# Patient Record
Sex: Female | Born: 1950 | Race: White | Hispanic: No | Marital: Married | Smoking: Former smoker
Health system: Southern US, Community
[De-identification: ages and names within clinical notes are randomized; demographics above are authoritative.]

## PROBLEM LIST (undated history)

## (undated) DIAGNOSIS — E785 Hyperlipidemia, unspecified: Secondary | ICD-10-CM

## (undated) DIAGNOSIS — I480 Paroxysmal atrial fibrillation: Secondary | ICD-10-CM

## (undated) HISTORY — DX: Hyperlipidemia, unspecified: E78.5

---

## 1998-08-19 ENCOUNTER — Other Ambulatory Visit: Admission: RE | Admit: 1998-08-19 | Discharge: 1998-08-19 | Payer: Self-pay | Admitting: *Deleted

## 1999-11-06 ENCOUNTER — Other Ambulatory Visit: Admission: RE | Admit: 1999-11-06 | Discharge: 1999-11-06 | Payer: Self-pay | Admitting: *Deleted

## 2001-12-04 ENCOUNTER — Other Ambulatory Visit: Admission: RE | Admit: 2001-12-04 | Discharge: 2001-12-04 | Payer: Self-pay | Admitting: *Deleted

## 2004-06-21 ENCOUNTER — Other Ambulatory Visit: Admission: RE | Admit: 2004-06-21 | Discharge: 2004-06-21 | Payer: Self-pay | Admitting: Family Medicine

## 2005-07-20 ENCOUNTER — Encounter: Admission: RE | Admit: 2005-07-20 | Discharge: 2005-07-20 | Payer: Self-pay | Admitting: Family Medicine

## 2007-07-14 ENCOUNTER — Other Ambulatory Visit: Admission: RE | Admit: 2007-07-14 | Discharge: 2007-07-14 | Payer: Self-pay | Admitting: Obstetrics and Gynecology

## 2010-08-01 ENCOUNTER — Other Ambulatory Visit
Admission: RE | Admit: 2010-08-01 | Discharge: 2010-08-01 | Payer: Self-pay | Source: Home / Self Care | Admitting: Family Medicine

## 2012-09-17 ENCOUNTER — Ambulatory Visit (INDEPENDENT_AMBULATORY_CARE_PROVIDER_SITE_OTHER): Payer: BC Managed Care – PPO | Admitting: Sports Medicine

## 2012-09-17 VITALS — BP 94/60 | Ht 63.0 in | Wt 118.0 lb

## 2012-09-17 DIAGNOSIS — M25519 Pain in unspecified shoulder: Secondary | ICD-10-CM

## 2012-09-17 DIAGNOSIS — M25511 Pain in right shoulder: Secondary | ICD-10-CM | POA: Insufficient documentation

## 2012-09-17 DIAGNOSIS — M25569 Pain in unspecified knee: Secondary | ICD-10-CM

## 2012-09-17 DIAGNOSIS — M25561 Pain in right knee: Secondary | ICD-10-CM

## 2012-09-17 NOTE — Assessment & Plan Note (Signed)
Exercises given with theraband Declined anti-inflammatories RTC in 4 weeks and if still hurting then will do MSK u/s

## 2012-09-17 NOTE — Assessment & Plan Note (Signed)
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  If still pain then would consider CSI vs nitro patch.

## 2012-09-17 NOTE — Progress Notes (Signed)
CC; Right knee pain.   HPI: 62 yo female coming in with right knee pain. Insidious onset worse with activity Is a runner but  Has been running less over the last 2 years due to pain.  Now can only run 2 miles without discomfort at the medial side of right knee. Denies radiation of pain, any numbness or any swelling of the area and describes it mores as discomfort then pain.  Patient patient does not remember any true injury.  Right shoulder pain- also has been insidious onset with no true injury. Patient has just noticed with certain movements she starts to have discomfort she describes as a dull ache with potential sharp pain. Patient denies any nighttime awakening, denies any radiation denies any numbness in the extremities. Patient denies any trouble with grasping or holding items. Patient denies any neck pain. Patient is still able to do all her activities of daily living. Patient has not tried any modalities. Patient is very adamant that she does not want to use any medications.  Patient's past medical, surgical, and family history reviewed without anything significant to her presenting problem.  Patient's past medical history is significant for hypercholesterolemia and is on Lipitor. Wife of Dr Vilinda Boehringer;  Worked as Engineer, civil (consulting)  Review of systems as stated above in history of present illness.  Physical exam Blood pressure 94/60, height 5\' 3"  (1.6 m), weight 118 lb (53.524 kg). General: No apparent distress alert and oriented x3 mood and affect normal Respiratory: Patient's speak in full sentences and does not appear short of breath Skin: Warm dry intact with no signs of infection or rash Neuro: Cranial nerves II through XII are intact, neurovascularly intact in all extremities with 2+ DTRs and 2+ pulses. Right knee exam: On inspection no gross deformity. Patient is nontender on exam. Patient has full active and passive range of motion. She is neurovascularly intact distally. All ligaments  appear to be intact. Patient has a negative McMurray sign. Right shoulder exam: Patient does have full active range of motion. She does have mild positive impingement signs. Negative labral signs. She is neurovascularly intact distally with 2+ DTRs and full-strength.  Musculoskeletal ultrasound was performed and interpreted by me today. Patient knee was examined and was found to have a degenerative tear of the medial meniscus. Does have some mild hypoechoic changes surrounding it but no neovascularization. Suprapatellar pouch effusion noted. Patient's lateral meniscus was normal. Patient's patellar and quadriceps tendons also are unremarkable. Patient tolerated the procedure well.

## 2013-05-18 ENCOUNTER — Other Ambulatory Visit: Payer: Self-pay | Admitting: Family Medicine

## 2013-05-18 DIAGNOSIS — R1011 Right upper quadrant pain: Secondary | ICD-10-CM

## 2013-05-20 ENCOUNTER — Ambulatory Visit
Admission: RE | Admit: 2013-05-20 | Discharge: 2013-05-20 | Disposition: A | Discharge status: Home / Self Care | Payer: BC Managed Care – PPO | Source: Ambulatory Visit | Attending: Family Medicine | Admitting: Family Medicine

## 2013-05-20 DIAGNOSIS — R1011 Right upper quadrant pain: Secondary | ICD-10-CM

## 2014-02-25 ENCOUNTER — Other Ambulatory Visit: Payer: Self-pay | Admitting: Gastroenterology

## 2014-02-25 DIAGNOSIS — R1310 Dysphagia, unspecified: Secondary | ICD-10-CM

## 2014-02-26 ENCOUNTER — Ambulatory Visit
Admission: RE | Admit: 2014-02-26 | Discharge: 2014-02-26 | Disposition: A | Discharge status: Home / Self Care | Payer: BC Managed Care – PPO | Source: Ambulatory Visit | Attending: Gastroenterology | Admitting: Gastroenterology

## 2014-02-26 DIAGNOSIS — R1310 Dysphagia, unspecified: Secondary | ICD-10-CM

## 2014-02-26 IMAGING — RF DG ESOPHAGUS
6 of 7 series · 20 of 24 positions shown · non-contrast
Comparison: None.

CLINICAL DATA: Dysphagia

EXAM:
ESOPHOGRAM / BARIUM SWALLOW / BARIUM TABLET STUDY
TECHNIQUE: Combined double contrast and single contrast examination performed
using effervescent crystals, thick barium liquid, and thin barium
liquid. The patient was observed with fluoroscopy swallowing a 13mm
barium sulphate tablet.
FLUOROSCOPY TIME:  One Min and 18 seconds

[Series 1: run · 15 of 30 slices shown (1 of 6)]
[im 1/30]
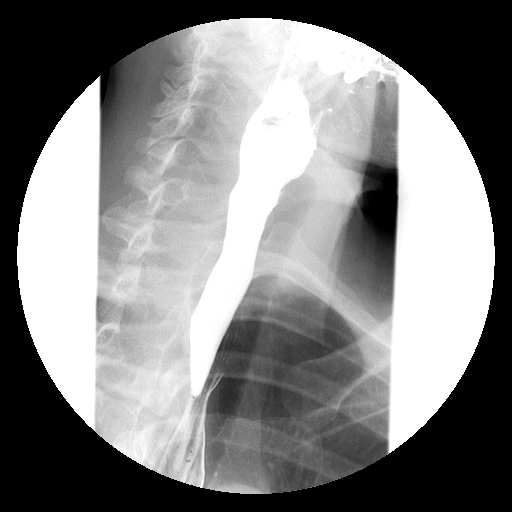
[im 2/30]
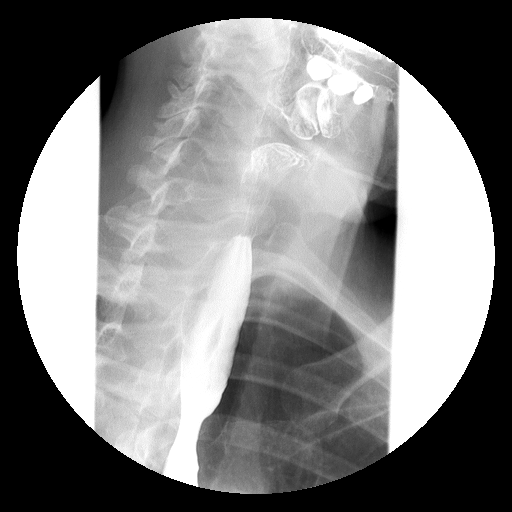
[im 6/30]
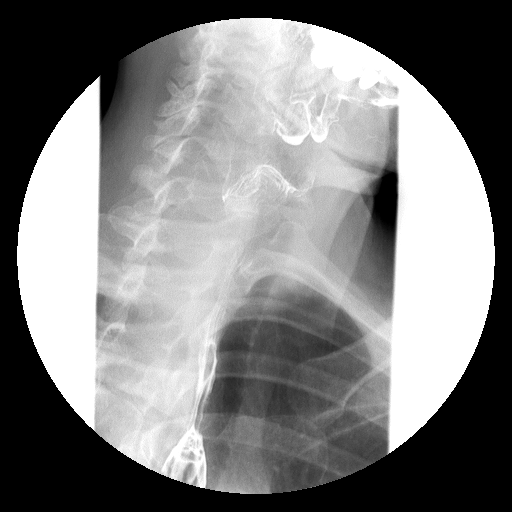
[im 7/30]
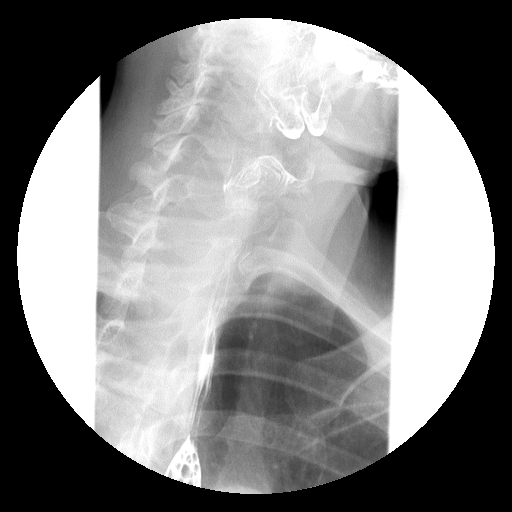
[im 9/30]
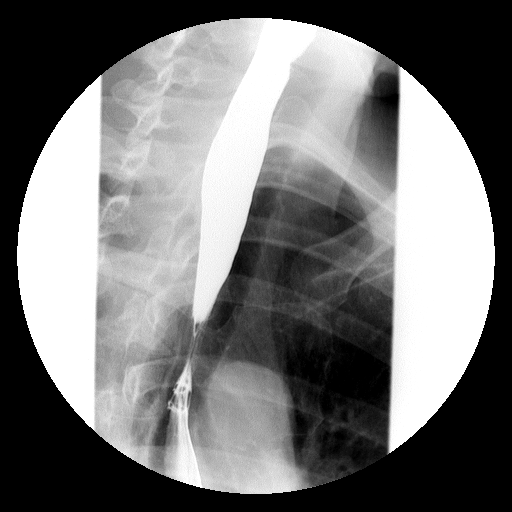
[im 11/30]
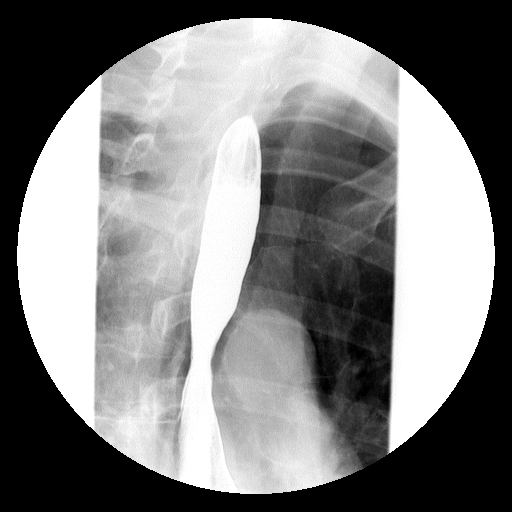
[im 12/30]
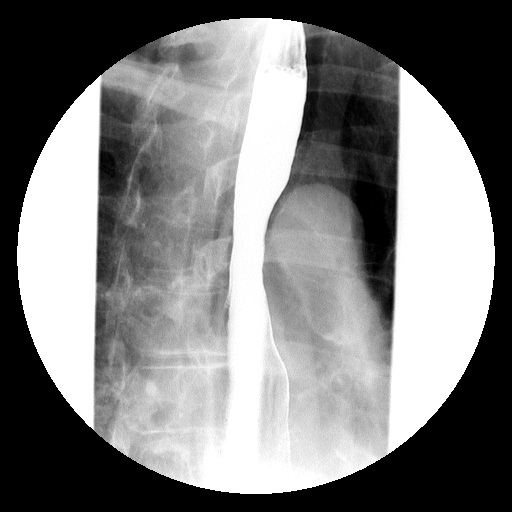
[im 16/30]
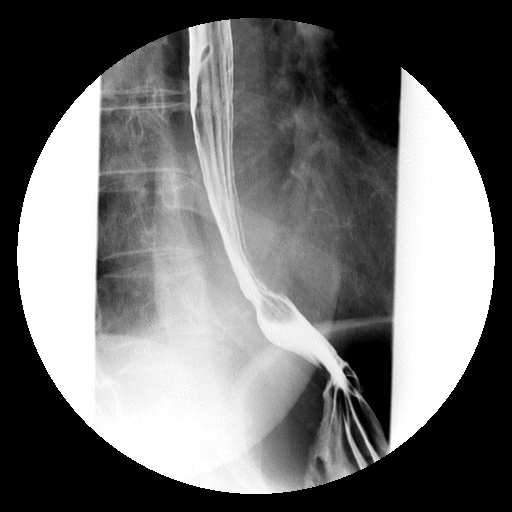
[im 18/30]
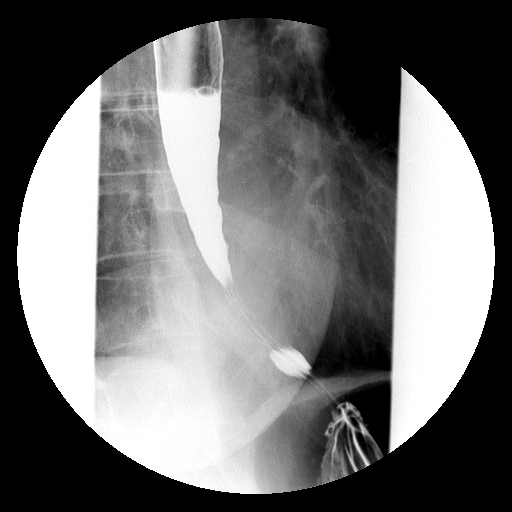
[im 19/30]
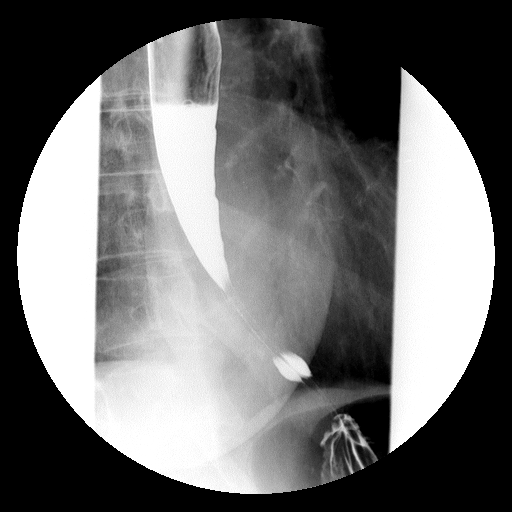
[im 21/30]
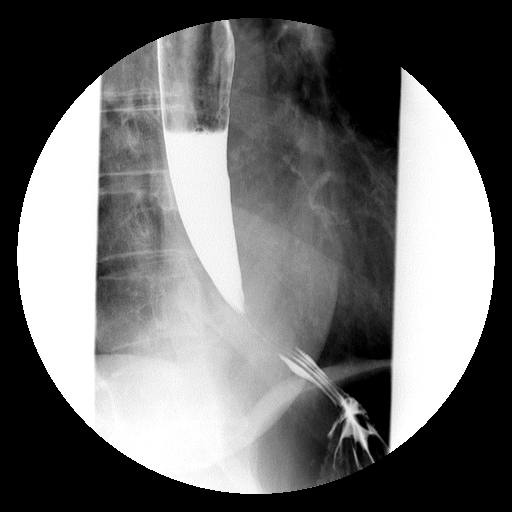
[im 23/30]
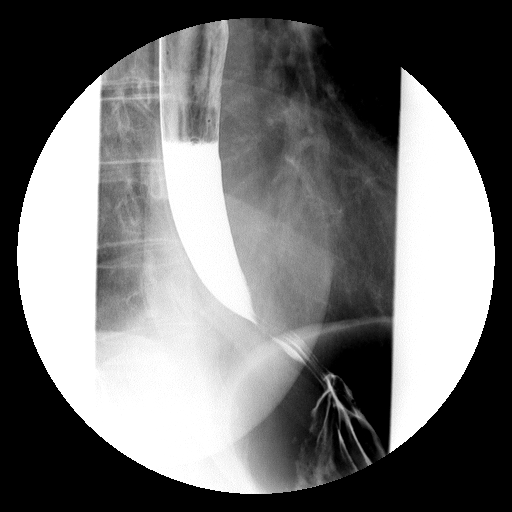
[im 26/30]
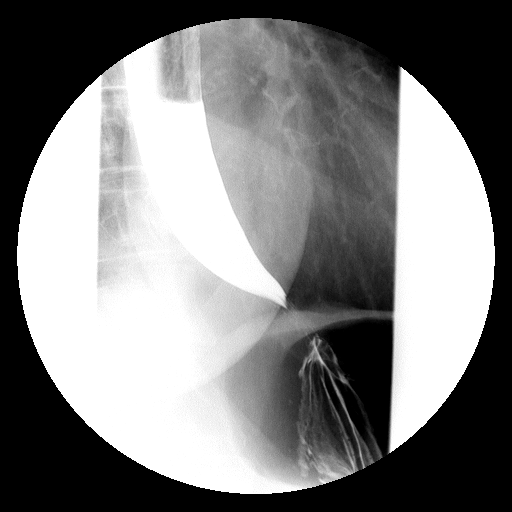
[im 28/30]
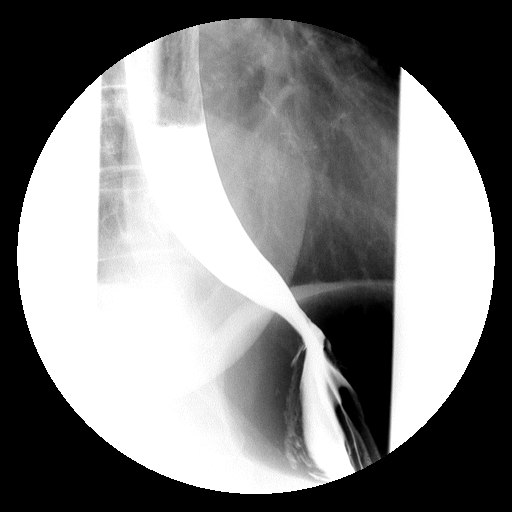
[im 30/30]
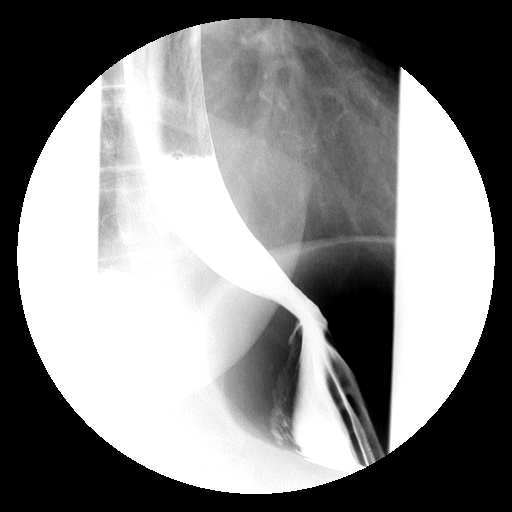

[Series 2: run · 1 of 1 slices shown (2 of 6)]
[im 1/1]
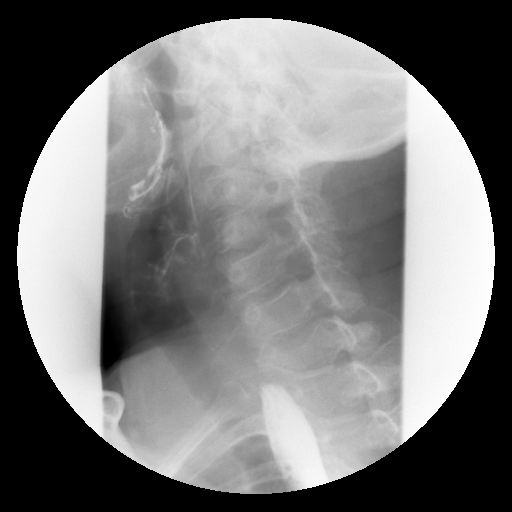

[Series 3: run · 1 of 1 slices shown (3 of 6)]
[im 1/1]
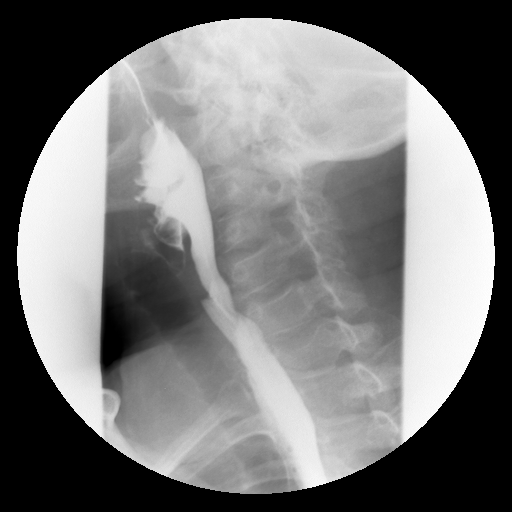

[Series 5: run · 1 of 1 slices shown (4 of 6)]
[im 1/1]
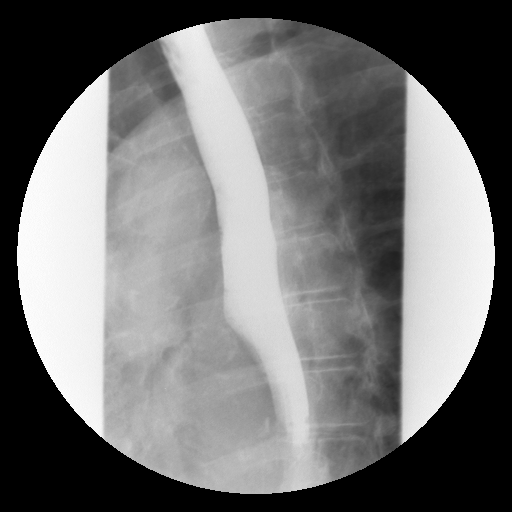

[Series 6: run · 1 of 1 slices shown (5 of 6)]
[im 1/1]
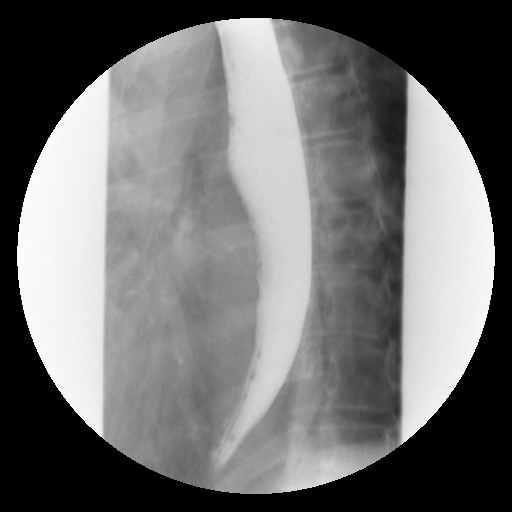

[Series 7: run · 1 of 2 slices shown (6 of 6)]
[im 1/2]
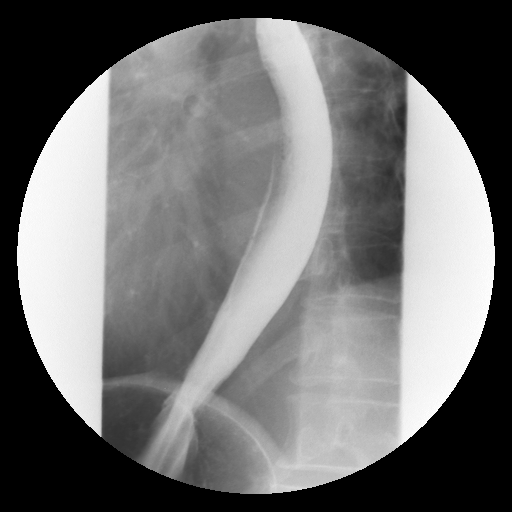

[20 of 24 positions shown; findings below may reference images not displayed]

FINDINGS: Esophageal mucosa and motility are normal. Esophageal mucosa is
smooth. No stricture or mass.

Negative for hiatal hernia or reflux.

Barium tablet passed readily into the stomach without delay.
IMPRESSION: Normal

## 2014-12-07 ENCOUNTER — Other Ambulatory Visit (HOSPITAL_COMMUNITY)
Admission: RE | Admit: 2014-12-07 | Discharge: 2014-12-07 | Disposition: A | Discharge status: Home / Self Care | Payer: BLUE CROSS/BLUE SHIELD | Source: Ambulatory Visit | Attending: Family Medicine | Admitting: Family Medicine

## 2014-12-07 ENCOUNTER — Other Ambulatory Visit (HOSPITAL_COMMUNITY): Payer: Self-pay | Admitting: Family Medicine

## 2014-12-07 DIAGNOSIS — Z01419 Encounter for gynecological examination (general) (routine) without abnormal findings: Secondary | ICD-10-CM | POA: Insufficient documentation

## 2014-12-08 LAB — CYTOLOGY - PAP

## 2015-05-06 ENCOUNTER — Ambulatory Visit (INDEPENDENT_AMBULATORY_CARE_PROVIDER_SITE_OTHER): Payer: BLUE CROSS/BLUE SHIELD | Admitting: Cardiology

## 2015-05-06 ENCOUNTER — Encounter: Payer: Self-pay | Admitting: Cardiology

## 2015-05-06 VITALS — BP 88/58 | HR 60 | Ht 63.0 in | Wt 123.0 lb

## 2015-05-06 DIAGNOSIS — I480 Paroxysmal atrial fibrillation: Secondary | ICD-10-CM

## 2015-05-06 LAB — BASIC METABOLIC PANEL WITH GFR
BUN: 14 mg/dL (ref 7–25)
CO2: 27 mmol/L (ref 20–31)
CREATININE: 0.76 mg/dL (ref 0.50–0.99)
Calcium: 8.4 mg/dL — ABNORMAL LOW (ref 8.6–10.4)
Chloride: 105 mmol/L (ref 98–110)
GFR, Est African American: >89 mL/min (ref >=60)
GFR, Est Non African American: 84 mL/min (ref >=60)
Glucose, Bld: 102 mg/dL — ABNORMAL HIGH (ref 65–99)
Potassium: 4 mmol/L (ref 3.5–5.3)
Sodium: 140 mmol/L (ref 135–146)

## 2015-05-06 NOTE — Patient Instructions (Signed)
Medication Instructions:  Your physician recommends that you continue on your current medications as directed. Please refer to the Current Medication list given to you today.  Labwork: TSH today  Testing/Procedures: Your physician has requested that you have an echocardiogram. Echocardiography is a painless test that uses sound waves to create images of your heart. It provides your doctor with information about the size and shape of your heart and how well your heart's chambers and valves are working. This procedure takes approximately one hour. There are no restrictions for this procedure.  Follow-Up: Your physician recommends that you schedule a follow-up appointment in: 3 months with Dr. Elberta Fortis.   Any Other Special Instructions Will Be Listed Below (If Applicable). Thank you for choosing [facility]!!   Dory Horn, RN [phone]

## 2015-05-06 NOTE — Progress Notes (Signed)
Electrophysiology Office Note   Date:  05/06/2015   ID:  Vilma Britian Jentz, DOB [birth year], MRN [id number]  PCP:  No primary care provider on file.  Primary Electrophysiologist: Ginger Leeth Jorja Loa, MD    Chief Complaint  Patient presents with  . Atrial Fibrillation     History of Present Illness: Karryn Kosinski is a 64 y.o. female who presents today for electrophysiology evaluation.   She was at getting a colonoscopy today and anesthesiologist noticed that she was in AF during the entire procedure.  She does not have any CHADS2VA2Sc risk factors currently.  She states that she has mild chest pain when exercising, but feels that it is due to possible exercise induced asthma.  She has been checked in the past for this by Dr. Katrinka Blazing.  She does check her pulse and occasionally it is irregular.  She has had no recent chest pain.  She exercises quite a bit, with running and yoga.  She was asymptomatic today with her AF.  She converted to SR sometime between her colonoscopy and her appointment here.   Today, she denies symptoms of palpitations, chest pain, shortness of breath, orthopnea, PND, lower extremity edema, claudication, dizziness, presyncope, syncope, bleeding, or neurologic sequela. The patient is tolerating medications without difficulties and is otherwise without complaint today.    Past Medical History  Diagnosis Date  . Hyperlipidemia    No history of surgery  Current Outpatient Prescriptions  Medication Sig Dispense Refill  . atorvastatin (LIPITOR) 10 MG tablet Take 10 mg by mouth daily.     No current facility-administered medications for this visit.    Allergies:   Review of patient's allergies indicates no known allergies.   Social History:  The patient  reports that she has quit smoking. She does not have any smokeless tobacco history on file. She reports that she drinks alcohol. She reports that she does not use illicit drugs.   Family History:  The patient's family  history includes Alzheimer's disease (age of onset: 35) in her mother; Diabetes in her father; Heart failure in her maternal grandmother; Hyperlipidemia in her brother; Osteopenia in her sister; Osteoporosis in her mother.    ROS:  Please see the history of present illness   All other systems are reviewed and negative.    PHYSICAL EXAM: VS:  BP 88/58 mmHg  Pulse 60  Ht  (1.6 m)  Wt 123 lb (55.792 kg)  BMI 21.79 kg/m2 , BMI Body mass index is 21.79 kg/(m^2). GEN: Well nourished, well developed, in no acute distress HEENT: normal Neck: no JVD, carotid bruits, or masses Cardiac: RRR; no murmurs, rubs, or gallops,no edema  Respiratory:  clear to auscultation bilaterally, normal work of breathing GI: soft, nontender, nondistended, + BS MS: no deformity or atrophy Skin: warm and dry Neuro:  Strength and sensation are intact Psych: euthymic mood, full affect  EKG:  EKG is ordered today. The ekg ordered today shows sinus rhythm, rate 60.  Possible biatrial enlargement  Recent Labs: No results found for requested labs within last 365 days.    Lipid Panel  No results found for: CHOL, TRIG, HDL, CHOLHDL, VLDL, LDLCALC, LDLDIRECT   Wt Readings from Last 3 Encounters:  05/06/15 123 lb (55.792 kg)  09/17/12 118 lb (53.524 kg)   ASSESSMENT AND PLAN:  1.  Atrial fibrillation: appears to be new onset as this is the first time it has been recorded.  She has a CHADS2VA2SC of 0 and therefore does not require  anticoagulation.  She has not had an evaluation yet for this.  Omer Monter therefore get a TSH and a TTE to look for reversible causes.  She may require antiarrhythmic therapy in the future, but she is reluctant to take any medications.     Current medicines are reviewed at length with the patient today.   The patient does not have concerns regarding her medicines.  The following changes were made today:  none  Labs/ tests ordered today include: TSH, TTE  Orders Placed This Encounter    Procedures  . TSH  . BASIC METABOLIC PANEL WITH GFR  . EKG 12-Lead  . ECHOCARDIOGRAM COMPLETE     Disposition:   FU with Arelia Volpe 3 months  Signed, Reynalda Canny Jorja Loa, MD  05/06/2015 5:03 PM     [facility] [address] [place] [address] [phone] (office) [phone] (fax)

## 2015-05-07 LAB — TSH: TSH: 1.351 u[IU]/mL (ref 0.350–4.500)

## 2015-05-17 ENCOUNTER — Other Ambulatory Visit: Payer: Self-pay

## 2015-05-17 ENCOUNTER — Ambulatory Visit (HOSPITAL_COMMUNITY): Payer: BLUE CROSS/BLUE SHIELD | Attending: Internal Medicine

## 2015-05-17 ENCOUNTER — Encounter: Payer: Self-pay | Admitting: Cardiology

## 2015-05-17 ENCOUNTER — Ambulatory Visit (INDEPENDENT_AMBULATORY_CARE_PROVIDER_SITE_OTHER): Payer: BLUE CROSS/BLUE SHIELD | Admitting: Cardiology

## 2015-05-17 VITALS — BP 108/62 | HR 105 | Ht 63.0 in | Wt 123.0 lb

## 2015-05-17 DIAGNOSIS — I071 Rheumatic tricuspid insufficiency: Secondary | ICD-10-CM | POA: Diagnosis not present

## 2015-05-17 DIAGNOSIS — I4891 Unspecified atrial fibrillation: Secondary | ICD-10-CM | POA: Insufficient documentation

## 2015-05-17 DIAGNOSIS — I517 Cardiomegaly: Secondary | ICD-10-CM | POA: Diagnosis not present

## 2015-05-17 DIAGNOSIS — E785 Hyperlipidemia, unspecified: Secondary | ICD-10-CM | POA: Insufficient documentation

## 2015-05-17 DIAGNOSIS — F17200 Nicotine dependence, unspecified, uncomplicated: Secondary | ICD-10-CM | POA: Diagnosis not present

## 2015-05-17 DIAGNOSIS — I340 Nonrheumatic mitral (valve) insufficiency: Secondary | ICD-10-CM | POA: Diagnosis not present

## 2015-05-17 DIAGNOSIS — I480 Paroxysmal atrial fibrillation: Secondary | ICD-10-CM

## 2015-05-17 MED ORDER — DRONEDARONE HCL 400 MG PO TABS: 400.0000 mg | ORAL_TABLET | Freq: Two times a day (BID) | ORAL | Status: DC

## 2015-05-17 NOTE — Progress Notes (Signed)
Patient in a-fib with HR at 100-130bpm during the exam. Dr. Anne FuSkains (DOD) was notified and is talking to her presently.  05/17/2015

## 2015-05-17 NOTE — Patient Instructions (Signed)
Medication Instructions:  Please start Multaq 400 mg once twice a day. Continue all other medications as listed.  Follow-Up: Follow up as scheduled.  Thank you for choosing [facility]!!

## 2015-05-17 NOTE — Progress Notes (Signed)
Cardiology Office Note   Date:  05/17/2015   ID:  Kathy Patel, DOB 04/18/1951, MRN [id number]  PCP:  No primary care provider on file.  Cardiologist:  EP: Dr. Elberta Fortis       History of Present Illness: Kathy Patel is a 64 y.o. female with newly diagnosed paroxysmal atrial fibrillation who was undergoing echo today by Dewitt Hoes when he noted heart rates ranging from 100-140 bpm in atrial fibrillation. I was Dr. of the day and came in to discuss this with her. She stated that she was feeling some tingling in her hands bilaterally, slightly nervous. No significant shortness of breath or chest pain. She notes that her blood pressures are usually in the upper 80s to 90s systolic. Her heart rate as well as can be in the 60s but sometimes in the 50s as well.  Her prior episodes of atrial fibrillation usually last a few hours duration then terminate. She thinks that she had an episode on Saturday at the beach. Her husband, Vilinda Boehringer M.D., gastroenterologist with [employer] [facility], felt her pulse at the time and noted some irregularity.  Preliminarily, her EF on echo appears normal.  She has not previously been on any medications.    Past Medical History  Diagnosis Date  . Hyperlipidemia     Past Surgical History  Procedure Laterality Date  . None      no history of surgery     Current Outpatient Prescriptions  Medication Sig Dispense Refill  . atorvastatin (LIPITOR) 10 MG tablet Take 10 mg by mouth daily.    Marland Kitchen dronedarone (MULTAQ) 400 MG tablet Take 1 tablet (400 mg total) by mouth 2 (two) times daily with a meal. 60 tablet    No current facility-administered medications for this visit.    Allergies:   Review of patient's allergies indicates no known allergies.    Social History:  The patient  reports that she has quit smoking. She does not have any smokeless tobacco history on file. She reports that she drinks alcohol. She reports that she does not use illicit drugs.   Family  History:  The patient's family history includes Alzheimer's disease (age of onset: 39) in her mother; Diabetes in her father; Heart failure in her maternal grandmother; Hyperlipidemia in her brother; Osteopenia in her sister; Osteoporosis in her mother.    ROS:  Please see the history of present illness.   Otherwise, review of systems are positive for none.   All other systems are reviewed and negative.    PHYSICAL EXAM: VS:  BP 108/62 mmHg  Pulse 105  Ht  (1.6 m)  Wt 123 lb (55.792 kg)  BMI 21.79 kg/m2 , BMI Body mass index is 21.79 kg/(m^2). GEN: Thin, in no acute distress HEENT: normal Neck: no JVD, carotid bruits, or masses Cardiac: Irreg irreg mildly tachy; no murmurs, rubs, or gallops,no edema  Respiratory:  clear to auscultation bilaterally, normal work of breathing GI: soft, nontender, nondistended, + BS MS: no deformity or atrophy Skin: warm and dry, no rash Neuro:  Strength and sensation are intact Psych: euthymic mood, full affect   EKG:  EKG is ordered today. The ekg ordered today demonstrates AFIB 105 normal QT, no ST changes   Recent Labs: 05/06/2015: BUN 14; Creat 0.76; Potassium 4.0; Sodium 140; TSH 1.351    Lipid Panel No results found for: CHOL, TRIG, HDL, CHOLHDL, VLDL, LDLCALC, LDLDIRECT    Wt Readings from Last 3 Encounters:  05/17/15 123 lb (55.792  kg)  05/06/15 123 lb (55.792 kg)  09/17/12 118 lb (53.524 kg)      Other studies Reviewed: Additional studies/ records that were reviewed today include: Prior office note, personal phone call to Dr. Elberta Fortisamnitz, labs, ECHO prelim reviewed.  Review of the above records demonstrates: as above   ASSESSMENT AND PLAN:  1.  Paroxysmal atrial fibrillation  - Discussed at length. Given her multiple episodes, recurrence, antiarrhythmic has been started. We have given her Multaq 400 mg twice a day. Samples have been given. She has an appointment next Friday with Dr. Elberta Fortisamnitz to evaluate her progress on this  antiarrhythmic. Given her paroxysmal nature of atrial fibrillation, it was felt best for her to be on anticoagulation however she does not wish to proceed with this medication. Understands risks of bleeding. Understands risks of potential stroke. One reason to initiate anticoagulation would be to provide 3 weeks of therapy if cardioversion was necessary in the future.   - Another option could be Tikosyn. She understands that this would require hospitalization and monitoring.  - Potentially, if she does not tolerate Multaq, one could proceed with workup for atrial fibrillation ablation.  - She is concerned about side effects of the medications. She is also concerned about reduction in her blood pressure which is already low. Closely monitor.  - ECHO prelim noted with normal EF. Await final read.    Current medicines are reviewed at length with the patient today.  The patient has concerns regarding medicines.  The following changes have been made:  Multaq  Labs/ tests ordered today include:  Orders Placed This Encounter  Procedures  . EKG 12-Lead     Disposition:   Follow-up with Dr.Camnimtz next Friday the 28th.  Mathews RobinsonsSigned, Estha Few, MD  05/17/2015 5:40 PM    [facility] [address], [place], [place]  [address] Phone: [phone]; Fax: [phone]

## 2015-05-18 ENCOUNTER — Encounter: Payer: Self-pay | Admitting: Cardiology

## 2015-05-18 NOTE — Telephone Encounter (Signed)
New message     [facility].

## 2015-05-19 NOTE — Telephone Encounter (Signed)
This encounter was created in error - please disregard.

## 2015-05-23 ENCOUNTER — Telehealth: Payer: Self-pay | Admitting: Cardiology

## 2015-05-23 MED ORDER — APIXABAN 5 MG PO TABS: 5.0000 mg | ORAL_TABLET | Freq: Two times a day (BID) | ORAL | Status: DC

## 2015-05-23 NOTE — Telephone Encounter (Signed)
Called by patient's husband.  Orville GovernNancy Patel had an episode of palpitations and chest pain overnight last night.  She is on Multaq but has had episodes while on the medication.  She is not currently anticoagulated.  Have called in Eliquis.  Ha Placeres discuss further treatment at appointment Friday including ablation vs medical management.  Orvis Stann

## 2015-05-27 ENCOUNTER — Ambulatory Visit (INDEPENDENT_AMBULATORY_CARE_PROVIDER_SITE_OTHER): Payer: BLUE CROSS/BLUE SHIELD | Admitting: Cardiology

## 2015-05-27 ENCOUNTER — Encounter: Payer: Self-pay | Admitting: *Deleted

## 2015-05-27 ENCOUNTER — Encounter: Payer: Self-pay | Admitting: Cardiology

## 2015-05-27 VITALS — BP 88/70 | HR 56 | Ht 63.0 in | Wt 120.0 lb

## 2015-05-27 DIAGNOSIS — R0602 Shortness of breath: Secondary | ICD-10-CM

## 2015-05-27 DIAGNOSIS — R072 Precordial pain: Secondary | ICD-10-CM | POA: Diagnosis not present

## 2015-05-27 DIAGNOSIS — Z01812 Encounter for preprocedural laboratory examination: Secondary | ICD-10-CM

## 2015-05-27 DIAGNOSIS — I480 Paroxysmal atrial fibrillation: Secondary | ICD-10-CM

## 2015-05-27 NOTE — Progress Notes (Signed)
Electrophysiology Office Note   Date:  05/27/2015   ID:  Kathy Patel, DOB 1951/04/03, MRN [id number]  PCP:  Allean FoundSMITH,CANDACE THIELE, MD  Primary Electrophysiologist:  Regan LemmingWill Martin Zurii Hewes, MD    Chief Complaint  Patient presents with  . Atrial Fibrillation     History of Present Illness: Kathy Governancy Sabina is a 64 y.o. female who presents today for electrophysiology evaluation.   She has a history of atrial fibrillation. 3 weeks ago she presented for a colonsoscopy and was noted to be in atrial fibrillation.  She has had multiple episodes since then, with palpitations and chest pressure being her main symptoms.  Her episodes have lasted up to 4 hours.  She was briefly started on Multaq, but has had episodes on this.    She also has symptoms of chest pressure and shortness of breath.  These occur when she is walking up hills and is reproducible.  She was given an inhaler which has helped; at times she has to take a few puffs to get the pressure to stop.  The pain does not radiate.  Today, she denies symptoms of palpitations, chest pain, shortness of breath, orthopnea, PND, lower extremity edema, claudication, dizziness, presyncope, syncope, bleeding, or neurologic sequela. The patient is tolerating medications without difficulties and is otherwise without complaint today.    Past Medical History  Diagnosis Date  . Hyperlipidemia    Past Surgical History  Procedure Laterality Date  . None      no history of surgery     Current Outpatient Prescriptions  Medication Sig Dispense Refill  . apixaban (ELIQUIS) 5 MG TABS tablet Take 1 tablet (5 mg total) by mouth 2 (two) times daily. 60 tablet 3  . atorvastatin (LIPITOR) 10 MG tablet Take 10 mg by mouth daily.    Marland Kitchen. dronedarone (MULTAQ) 400 MG tablet Take 1 tablet (400 mg total) by mouth 2 (two) times daily with a meal. 60 tablet    No current facility-administered medications for this visit.    Allergies:   Review of patient's allergies  indicates no known allergies.   Social History:  The patient  reports that she has quit smoking. She does not have any smokeless tobacco history on file. She reports that she drinks alcohol. She reports that she does not use illicit drugs.   Family History:  The patient's family history includes Alzheimer's disease (age of onset: 9260) in her mother; Diabetes in her father; Heart failure in her maternal grandmother; Hyperlipidemia in her brother; Osteopenia in her sister; Osteoporosis in her mother.    ROS:  Please see the history of present illness.   Otherwise, review of systems is positive for palpitations, chest pressure.   All other systems are reviewed and negative.    PHYSICAL EXAM: VS:  Ht 5\' 3"  (1.6 m) , BMI There is no weight on file to calculate BMI. GEN: Well nourished, well developed, in no acute distress HEENT: normal Neck: no JVD, carotid bruits, or masses Cardiac: RRR; no murmurs, rubs, or gallops,no edema  Respiratory:  clear to auscultation bilaterally, normal work of breathing GI: soft, nontender, nondistended, + BS MS: no deformity or atrophy Skin: warm and dry Neuro:  Strength and sensation are intact Psych: euthymic mood, full affect  EKG:  EKG is ordered today. The ekg ordered today shows sinus rhythm, RAA  Recent Labs: 05/06/2015: BUN 14; Creat 0.76; Potassium 4.0; Sodium 140; TSH 1.351    Lipid Panel  No results found for: CHOL, TRIG, HDL,  CHOLHDL, VLDL, LDLCALC, LDLDIRECT   Wt Readings from Last 3 Encounters:  05/17/15 123 lb (55.792 kg)  05/06/15 123 lb (55.792 kg)  09/17/12 118 lb (53.524 kg)      Other studies Reviewed: Additional studies/ records that were reviewed today include: TTE  Review of the above records today demonstrates:  - LVEF 55-60%, normal wall thickness, normal wall motion, mild MR, moderate LAE, moderate TR, RVSP 37 mmHg, normal IVC.    ASSESSMENT AND PLAN:  1.  Atrial fibrillation:  Has tried Multaq but has had symptoms  on that medication.  Due to her high amount of AF, have put her on anticoagulation for now.  Unfortunately for her, class I agents would not be good as she is bradycardic at baseline and would not be able to start a beta blocker along with the medication.  Discussed other options for medications including amiodarone and tikosyn vs ablation.  She would prefer ablation.  Discussed risks and benefits, including bleeding, tamponade and stroke.  Zeniyah Peaster schedule as soon as feasible.  2. Chest pain: possibly cardiac but she feels that this is due to exercise induced asthma.  Marigold Mom order stress test with imaging as it is reproducible with exertion.   Current medicines are reviewed at length with the patient today.   The patient does not have concerns regarding her medicines.  The following changes were made today:  none  Labs/ tests ordered today include: AF ablation  No orders of the defined types were placed in this encounter.     Disposition:   FU with Davinder Haff post ablation.  Signed, Maddock Finigan Jorja Loa, MD  05/27/2015 2:29 PM     [facility] [address] [place] [address] [phone] (office) [phone] (fax)

## 2015-05-27 NOTE — Patient Instructions (Addendum)
Medication Instructions:  Your physician recommends that you continue on your current medications as directed. Please refer to the Current Medication list given to you today.  Labwork: Your physician recommends that you return for lab work in: 1 week prior to 12/16, for pre procedure labs.  Testing/Procedures: Your physician has requested that you have an exercise tolerance test. For further information please visit [email or web address] Please also follow instruction sheet, as given.  Your physician has requested that you have cardiac CT (this needs to be scheduled with in 7 days prior to ablation on 12/16). Cardiac computed tomography (CT) is a painless test that uses an x-ray machine to take clear, detailed pictures of your heart. For further information please visit [email or web address] Please follow instruction sheet as given.  Your physician has recommended that you have an ablation. Catheter ablation is a medical procedure used to treat some cardiac arrhythmias (irregular heartbeats). During catheter ablation, a long, thin, flexible tube is put into a blood vessel in your groin (upper thigh), or neck. This tube is called an ablation catheter. It is then guided to your heart through the blood vessel. Radio frequency waves destroy small areas of heart tissue where abnormal heartbeats may cause an arrhythmia to start. Please see the instruction sheet given to you today.  Follow-Up: Your physician recommends that you schedule a follow-up appointment in: 4 weeks with Rudi Cocoonna Carroll, NP after your ablation on 12/16.  Your physician recommends that you schedule a follow-up appointment in: 3 months with Dr Elberta Fortisamnitz, after your ablation on 12/16.   Any Other Special Instructions Will Be Listed Below (If Applicable).  If you need a refill on your cardiac medications before your next appointment, please call your pharmacy.  Thank you for choosing  [facility]!!   Dory HornSherri Afton Mikelson,  RN [phone]    Cardiac Ablation Cardiac ablation is a procedure to disable a small amount of heart tissue in very specific places. The heart has many electrical connections. Sometimes these connections are abnormal and can cause the heart to beat very fast or irregularly. By disabling some of the problem areas, heart rhythm can be improved or made normal. Ablation is done for people who:   Have Wolff-Parkinson-White syndrome.   Have other fast heart rhythms (tachycardia).   Have taken medicines for an abnormal heart rhythm (arrhythmia) that resulted in:   No success.   Side effects.   May have a high-risk heartbeat that could result in death.  LET [facility] PROVIDER KNOW ABOUT:   Any allergies you have or any previous reactions you have had to X-ray dye, food (such as seafood), medicine, or tape.   All medicines you are taking, including vitamins, herbs, eye drops, creams, and over-the-counter medicines.   Previous problems you or members of your family have had with the use of anesthetics.   Any blood disorders you have.   Previous surgeries or procedures (such as a kidney transplant) you have had.   Medical conditions you have (such as kidney failure).  RISKS AND COMPLICATIONS Generally, cardiac ablation is a safe procedure. However, problems can occur and include:   Increased risk of cancer. Depending on how long it takes to do the ablation, the dose of radiation can be high.  Bruising and bleeding where a thin, flexible tube (catheter) was inserted during the procedure.   Bleeding into the chest, especially into the sac that surrounds the heart (serious).  Need for a permanent pacemaker if the normal electrical system is damaged.  The procedure may not be fully effective, and this may not be recognized for months. Repeat ablation procedures are sometimes required. BEFORE THE PROCEDURE   Follow any instructions from your health care provider  regarding eating and drinking before the procedure.   Take your medicines as directed at regular times with water, unless instructed otherwise by your health care provider. If you are taking diabetes medicine, including insulin, ask how you are to take it and if there are any special instructions you should follow. It is common to adjust insulin dosing the day of the ablation.  PROCEDURE  An ablation is usually performed in a catheterization laboratory with the guidance of fluoroscopy. Fluoroscopy is a type of X-ray that helps your health care provider see images of your heart during the procedure.   An ablation is a minimally invasive procedure. This means a small cut (incision) is made in either your neck or groin. Your health care provider will decide where to make the incision based on your medical history and physical exam.  An IV tube will be started before the procedure begins. You will be given an anesthetic or medicine to help you relax (sedative).  The skin on your neck or groin will be numbed. A needle will be inserted into a large vein in your neck or groin and catheters will be threaded to your heart.  A special dye that shows up on fluoroscopy pictures may be injected through the catheter. The dye helps your health care provider see the area of the heart that needs treatment.  The catheter has electrodes on the tip. When the area of heart tissue that is causing the arrhythmia is found, the catheter tip will send an electrical current to the area and "scar" the tissue. Three types of energy can be used to ablate the heart tissue:   Heat (radiofrequency energy).   Laser energy.   Extreme cold (cryoablation).   When the area of the heart has been ablated, the catheter will be taken out. Pressure will be held on the insertion site. This will help the insertion site clot and keep it from bleeding. A bandage will be placed on the insertion site.  AFTER THE PROCEDURE   After  the procedure, you will be taken to a recovery area where your vital signs (blood pressure, heart rate, and breathing) will be monitored. The insertion site will also be monitored for bleeding.   You will need to lie still for 4-6 hours. This is to ensure you do not bleed from the catheter insertion site.    This information is not intended to replace advice given to you by your health care provider. Make sure you discuss any questions you have with your health care provider.   Document Released: 12/02/2008 Document Revised: 08/06/2014 Document Reviewed: 12/08/2012 Elsevier Interactive Patient Education [employer]

## 2015-05-30 ENCOUNTER — Encounter: Payer: Self-pay | Admitting: Cardiology

## 2015-06-03 NOTE — Addendum Note (Signed)
Addended by: Reesa ChewJONES, Aragon Scarantino G on: 06/03/2015 03:05 PM   Modules accepted: Orders

## 2015-06-07 ENCOUNTER — Encounter: Payer: Self-pay | Admitting: Cardiology

## 2015-06-13 ENCOUNTER — Ambulatory Visit (INDEPENDENT_AMBULATORY_CARE_PROVIDER_SITE_OTHER): Payer: BLUE CROSS/BLUE SHIELD

## 2015-06-13 ENCOUNTER — Encounter: Payer: BLUE CROSS/BLUE SHIELD | Admitting: Cardiology

## 2015-06-13 DIAGNOSIS — R0602 Shortness of breath: Secondary | ICD-10-CM

## 2015-06-13 DIAGNOSIS — R072 Precordial pain: Secondary | ICD-10-CM

## 2015-06-13 LAB — EXERCISE TOLERANCE TEST
Estimated workload: 11.8 METS
Exercise duration (min): 10 min
Exercise duration (sec): 6 s
MPHR: 157 {beats}/min
Peak HR: 155 {beats}/min
Percent HR: 99 %
RPE: 17
Rest HR: 54 {beats}/min

## 2015-06-14 ENCOUNTER — Encounter: Payer: Self-pay | Admitting: Cardiology

## 2015-06-14 NOTE — Telephone Encounter (Signed)
This encounter was created in error - please disregard.

## 2015-06-14 NOTE — Telephone Encounter (Signed)
New problem ° ° °Pt returning your call. °

## 2015-07-04 ENCOUNTER — Encounter (HOSPITAL_COMMUNITY): Payer: Self-pay | Admitting: Pharmacy Technician

## 2015-07-07 ENCOUNTER — Encounter: Payer: Self-pay | Admitting: *Deleted

## 2015-07-07 ENCOUNTER — Other Ambulatory Visit (INDEPENDENT_AMBULATORY_CARE_PROVIDER_SITE_OTHER): Payer: BLUE CROSS/BLUE SHIELD

## 2015-07-07 DIAGNOSIS — Z01812 Encounter for preprocedural laboratory examination: Secondary | ICD-10-CM

## 2015-07-07 DIAGNOSIS — I480 Paroxysmal atrial fibrillation: Secondary | ICD-10-CM

## 2015-07-07 LAB — CBC WITH DIFFERENTIAL/PLATELET
BASOS ABS: 0 10*3/uL (ref 0.0–0.1)
Basophils Relative: 1 % (ref 0–1)
Eosinophils Absolute: 0.2 10*3/uL (ref 0.0–0.7)
Eosinophils Relative: 4 % (ref 0–5)
HCT: 40.5 % (ref 36.0–46.0)
Hemoglobin: 13.4 g/dL (ref 12.0–15.0)
Lymphocytes Relative: 29 % (ref 12–46)
Lymphs Abs: 1.1 10*3/uL (ref 0.7–4.0)
MCH: 30.4 pg (ref 26.0–34.0)
MCHC: 33.1 g/dL (ref 30.0–36.0)
MCV: 91.8 fL (ref 78.0–100.0)
MPV: 9.5 fL (ref 8.6–12.4)
Monocytes Absolute: 0.5 10*3/uL (ref 0.1–1.0)
Monocytes Relative: 12 % (ref 3–12)
Neutro Abs: 2.1 10*3/uL (ref 1.7–7.7)
Neutrophils Relative %: 54 % (ref 43–77)
Platelets: 283 10*3/uL (ref 150–400)
RBC: 4.41 MIL/uL (ref 3.87–5.11)
RDW: 13.6 % (ref 11.5–15.5)
WBC: 3.9 10*3/uL — ABNORMAL LOW (ref 4.0–10.5)

## 2015-07-07 LAB — BASIC METABOLIC PANEL
BUN: 11 mg/dL (ref 7–25)
CO2: 27 mmol/L (ref 20–31)
Calcium: 9 mg/dL (ref 8.6–10.4)
Chloride: 108 mmol/L (ref 98–110)
Creat: 0.7 mg/dL (ref 0.50–0.99)
Glucose, Bld: 89 mg/dL (ref 65–99)
POTASSIUM: 4.5 mmol/L (ref 3.5–5.3)
SODIUM: 147 mmol/L — AB (ref 135–146)

## 2015-07-07 NOTE — Telephone Encounter (Signed)
This encounter was created in error - please disregard.

## 2015-07-08 ENCOUNTER — Ambulatory Visit (HOSPITAL_COMMUNITY)
Admission: RE | Admit: 2015-07-08 | Discharge: 2015-07-08 | Disposition: A | Discharge status: Home / Self Care | Payer: BLUE CROSS/BLUE SHIELD | Source: Ambulatory Visit | Attending: Cardiology | Admitting: Cardiology

## 2015-07-08 ENCOUNTER — Encounter (HOSPITAL_COMMUNITY): Payer: Self-pay

## 2015-07-08 ENCOUNTER — Other Ambulatory Visit: Payer: BLUE CROSS/BLUE SHIELD

## 2015-07-08 DIAGNOSIS — I517 Cardiomegaly: Secondary | ICD-10-CM | POA: Diagnosis not present

## 2015-07-08 DIAGNOSIS — R079 Chest pain, unspecified: Secondary | ICD-10-CM | POA: Diagnosis not present

## 2015-07-08 DIAGNOSIS — I480 Paroxysmal atrial fibrillation: Secondary | ICD-10-CM | POA: Diagnosis not present

## 2015-07-08 DIAGNOSIS — Z01812 Encounter for preprocedural laboratory examination: Secondary | ICD-10-CM | POA: Insufficient documentation

## 2015-07-08 MED ORDER — NITROGLYCERIN 0.4 MG SL SUBL
0.4000 mg | SUBLINGUAL_TABLET | SUBLINGUAL | Status: DC | PRN
  Administered 2015-07-08: 0.4 mg via SUBLINGUAL
  Filled 2015-07-08: qty 25

## 2015-07-08 MED ORDER — IOHEXOL 350 MG/ML SOLN
80.0000 mL | Freq: Once | INTRAVENOUS | Status: AC | PRN
  Administered 2015-07-08: 80 mL via INTRAVENOUS

## 2015-07-08 MED ORDER — NITROGLYCERIN 0.4 MG SL SUBL
SUBLINGUAL_TABLET | SUBLINGUAL | Status: AC
  Filled 2015-07-08: qty 1

## 2015-07-08 NOTE — Progress Notes (Signed)
CT completed. Tolerated well. Awake and alert. In no distress. Tolerated coffee and crackers. D/C home walking.

## 2015-07-12 ENCOUNTER — Telehealth: Payer: Self-pay | Admitting: Cardiology

## 2015-07-12 NOTE — Telephone Encounter (Signed)
Iris from [facility] called and state that the patient was under the understanding that she will be admitted to the hospital on 07-15-15 for her ablation.  I told her that I will have you gave the patient a call to discuss if she will be outpatient or admitted.

## 2015-07-13 NOTE — Telephone Encounter (Signed)
Informed patient that she would be an overnight stay after ablation. She verbalized understanding.

## 2015-07-15 ENCOUNTER — Ambulatory Visit (HOSPITAL_COMMUNITY)
Admission: RE | Admit: 2015-07-15 | Discharge: 2015-07-16 | Disposition: A | Discharge status: Home / Self Care | Payer: BLUE CROSS/BLUE SHIELD | Source: Ambulatory Visit | Attending: Cardiology | Admitting: Cardiology

## 2015-07-15 ENCOUNTER — Encounter (HOSPITAL_COMMUNITY)
Admission: RE | Disposition: A | Discharge status: Home / Self Care | Payer: Self-pay | Source: Ambulatory Visit | Attending: Cardiology

## 2015-07-15 ENCOUNTER — Encounter (HOSPITAL_COMMUNITY): Payer: Self-pay | Admitting: Certified Registered"

## 2015-07-15 ENCOUNTER — Ambulatory Visit (HOSPITAL_COMMUNITY): Payer: BLUE CROSS/BLUE SHIELD | Admitting: Certified Registered"

## 2015-07-15 DIAGNOSIS — R001 Bradycardia, unspecified: Secondary | ICD-10-CM | POA: Insufficient documentation

## 2015-07-15 DIAGNOSIS — E785 Hyperlipidemia, unspecified: Secondary | ICD-10-CM | POA: Insufficient documentation

## 2015-07-15 DIAGNOSIS — I480 Paroxysmal atrial fibrillation: Secondary | ICD-10-CM | POA: Diagnosis not present

## 2015-07-15 DIAGNOSIS — Z87891 Personal history of nicotine dependence: Secondary | ICD-10-CM | POA: Diagnosis not present

## 2015-07-15 DIAGNOSIS — I4891 Unspecified atrial fibrillation: Secondary | ICD-10-CM | POA: Diagnosis present

## 2015-07-15 DIAGNOSIS — Z7901 Long term (current) use of anticoagulants: Secondary | ICD-10-CM | POA: Diagnosis not present

## 2015-07-15 DIAGNOSIS — Z79899 Other long term (current) drug therapy: Secondary | ICD-10-CM | POA: Diagnosis not present

## 2015-07-15 HISTORY — DX: Paroxysmal atrial fibrillation: I48.0

## 2015-07-15 HISTORY — PX: ELECTROPHYSIOLOGIC STUDY: SHX172A

## 2015-07-15 HISTORY — PX: ATRIAL FIBRILLATION ABLATION: SHX5732

## 2015-07-15 LAB — POCT ACTIVATED CLOTTING TIME
ACTIVATED CLOTTING TIME: 343 s
ACTIVATED CLOTTING TIME: 374 s
ACTIVATED CLOTTING TIME: 137 s
Activated Clotting Time: 307 seconds

## 2015-07-15 LAB — MRSA PCR SCREENING: MRSA BY PCR: NEGATIVE

## 2015-07-15 SURGERY — ATRIAL FIBRILLATION ABLATION
Anesthesia: Monitor Anesthesia Care

## 2015-07-15 MED ORDER — SODIUM CHLORIDE 0.9 % IV SOLN: 250.0000 mL | INTRAVENOUS | Status: DC | PRN

## 2015-07-15 MED ORDER — SODIUM CHLORIDE 0.9 % IJ SOLN: 3.0000 mL | INTRAMUSCULAR | Status: DC | PRN

## 2015-07-15 MED ORDER — HEPARIN (PORCINE) IN NACL 2-0.9 UNIT/ML-% IJ SOLN
INTRAMUSCULAR | Status: AC
  Filled 2015-07-15: qty 500

## 2015-07-15 MED ORDER — BUPIVACAINE HCL (PF) 0.25 % IJ SOLN
INTRAMUSCULAR | Status: AC
  Filled 2015-07-15: qty 60

## 2015-07-15 MED ORDER — HEPARIN SODIUM (PORCINE) 1000 UNIT/ML IJ SOLN
INTRAMUSCULAR | Status: DC | PRN
  Administered 2015-07-15: 1000 [IU] via INTRAVENOUS

## 2015-07-15 MED ORDER — APIXABAN 5 MG PO TABS
5.0000 mg | ORAL_TABLET | Freq: Two times a day (BID) | ORAL | Status: DC
  Administered 2015-07-15 – 2015-07-16 (×3): 5 mg via ORAL
  Filled 2015-07-15 (×3): qty 1

## 2015-07-15 MED ORDER — HEPARIN SODIUM (PORCINE) 1000 UNIT/ML IJ SOLN
INTRAMUSCULAR | Status: AC
  Filled 2015-07-15: qty 1

## 2015-07-15 MED ORDER — FENTANYL CITRATE (PF) 100 MCG/2ML IJ SOLN
INTRAMUSCULAR | Status: DC | PRN
  Administered 2015-07-15 (×2): 50 ug via INTRAVENOUS

## 2015-07-15 MED ORDER — FENTANYL CITRATE (PF) 100 MCG/2ML IJ SOLN: 25.0000 ug | INTRAMUSCULAR | Status: DC | PRN

## 2015-07-15 MED ORDER — ACETAMINOPHEN 325 MG PO TABS
650.0000 mg | ORAL_TABLET | ORAL | Status: DC | PRN
  Administered 2015-07-15: 650 mg via ORAL
  Filled 2015-07-15: qty 2

## 2015-07-15 MED ORDER — SODIUM CHLORIDE 0.9 % IJ SOLN
3.0000 mL | Freq: Two times a day (BID) | INTRAMUSCULAR | Status: DC
  Administered 2015-07-15 – 2015-07-16 (×3): 3 mL via INTRAVENOUS

## 2015-07-15 MED ORDER — BUPIVACAINE HCL (PF) 0.25 % IJ SOLN
INTRAMUSCULAR | Status: DC | PRN
  Administered 2015-07-15: 37 mL

## 2015-07-15 MED ORDER — ALUM & MAG HYDROXIDE-SIMETH 200-200-20 MG/5ML PO SUSP
15.0000 mL | ORAL | Status: DC | PRN
  Administered 2015-07-15 – 2015-07-16 (×3): 15 mL via ORAL
  Filled 2015-07-15 (×3): qty 30

## 2015-07-15 MED ORDER — ONDANSETRON HCL 4 MG/2ML IJ SOLN: 4.0000 mg | Freq: Four times a day (QID) | INTRAMUSCULAR | Status: DC | PRN

## 2015-07-15 MED ORDER — LACTATED RINGERS IV SOLN
INTRAVENOUS | Status: DC | PRN
  Administered 2015-07-15: 08:00:00 via INTRAVENOUS

## 2015-07-15 MED ORDER — PROPOFOL 500 MG/50ML IV EMUL
INTRAVENOUS | Status: DC | PRN
  Administered 2015-07-15: 100 ug/kg/min via INTRAVENOUS

## 2015-07-15 MED ORDER — HEPARIN (PORCINE) IN NACL 2-0.9 UNIT/ML-% IJ SOLN
INTRAMUSCULAR | Status: DC | PRN
  Administered 2015-07-15: 11:00:00

## 2015-07-15 MED ORDER — MIDAZOLAM HCL 5 MG/5ML IJ SOLN
INTRAMUSCULAR | Status: DC | PRN
  Administered 2015-07-15: 2 mg via INTRAVENOUS

## 2015-07-15 MED ORDER — PROPOFOL 10 MG/ML IV BOLUS
INTRAVENOUS | Status: DC | PRN
  Administered 2015-07-15 (×4): 20 mg via INTRAVENOUS

## 2015-07-15 MED ORDER — PROTAMINE SULFATE 10 MG/ML IV SOLN
INTRAVENOUS | Status: DC | PRN
  Administered 2015-07-15: 40 mg via INTRAVENOUS

## 2015-07-15 MED ORDER — PROMETHAZINE HCL 25 MG/ML IJ SOLN: 6.2500 mg | INTRAMUSCULAR | Status: DC | PRN

## 2015-07-15 MED ORDER — ATORVASTATIN CALCIUM 10 MG PO TABS
10.0000 mg | ORAL_TABLET | Freq: Every day | ORAL | Status: DC
  Administered 2015-07-15 – 2015-07-16 (×2): 10 mg via ORAL
  Filled 2015-07-15 (×2): qty 1

## 2015-07-15 MED ORDER — HEPARIN SODIUM (PORCINE) 1000 UNIT/ML IJ SOLN
INTRAMUSCULAR | Status: DC | PRN
  Administered 2015-07-15: 12000 [IU] via INTRAVENOUS

## 2015-07-15 SURGICAL SUPPLY — 20 items
BAG SNAP BAND KOVER 36X36 (MISCELLANEOUS) ×2 IMPLANT
BLANKET WARM UNDERBOD FULL ACC (MISCELLANEOUS) ×2 IMPLANT
CATH NAVISTAR SMARTTOUCH DF (ABLATOR) ×2 IMPLANT
CATH SOUNDSTAR 3D IMAGING (CATHETERS) ×2 IMPLANT
CATH VARIABLE LASSO NAV 2515 (CATHETERS) ×1 IMPLANT
CATH WEBSTER BI DIR CS D-F CRV (CATHETERS) ×2 IMPLANT
COVER SWIFTLINK CONNECTOR (BAG) ×2 IMPLANT
NDL TRANSSEPTAL BRK 98CM (NEEDLE) IMPLANT
NEEDLE TRANSSEPTAL BRK 98CM (NEEDLE) ×2 IMPLANT
PACK EP LATEX FREE (CUSTOM PROCEDURE TRAY) ×2
PACK EP LF (CUSTOM PROCEDURE TRAY) ×1 IMPLANT
PAD DEFIB LIFELINK (PAD) ×2 IMPLANT
PATCH CARTO3 (PAD) ×2 IMPLANT
SHEATH AGILIS NXT 8.5F 71CM (SHEATH) ×2 IMPLANT
SHEATH AVANTI 11F 11CM (SHEATH) ×2 IMPLANT
SHEATH PINNACLE 7F 10CM (SHEATH) ×2 IMPLANT
SHEATH PINNACLE 8F 10CM (SHEATH) ×3 IMPLANT
SHEATH PINNACLE 9F 10CM (SHEATH) ×3 IMPLANT
SHIELD RADPAD SCOOP 12X17 (MISCELLANEOUS) ×2 IMPLANT
TUBING SMART ABLATE COOLFLOW (TUBING) ×2 IMPLANT

## 2015-07-15 NOTE — Anesthesia Preprocedure Evaluation (Addendum)
Anesthesia Evaluation  Patient identified by MRN, date of birth, ID band Patient awake    Reviewed: Allergy & Precautions, NPO status , Patient's Chart, lab work & pertinent test results  Airway Mallampati: II  TM Distance: >3 FB Neck ROM: Full    Dental  (+) Teeth Intact, Dental Advisory Given   Pulmonary former smoker,    breath sounds clear to auscultation       Cardiovascular negative cardio ROS   Rhythm:Regular Rate:Normal     Neuro/Psych    GI/Hepatic negative GI ROS, Neg liver ROS,   Endo/Other  negative endocrine ROS  Renal/GU negative Renal ROS     Musculoskeletal   Abdominal   Peds  Hematology   Anesthesia Other Findings   Reproductive/Obstetrics                            Anesthesia Physical Anesthesia Plan  ASA: III  Anesthesia Plan: MAC   Post-op Pain Management:    Induction: Intravenous  Airway Management Planned: Natural Airway and Nasal Cannula  Additional Equipment: None  Intra-op Plan:   Post-operative Plan:   Informed Consent: I have reviewed the patients History and Physical, chart, labs and discussed the procedure including the risks, benefits and alternatives for the proposed anesthesia with the patient or authorized representative who has indicated his/her understanding and acceptance.   Dental advisory given  Plan Discussed with: CRNA, Anesthesiologist and Surgeon  Anesthesia Plan Comments:         Anesthesia Quick Evaluation

## 2015-07-15 NOTE — H&P (Signed)
Date:  07/15/2015   ID:  Kathy Patel, DOB 1951-02-11, MRN [id number]  Kathy Patel,Kathy THIELE, Kathy Patel   Primary Electrophysiologist: Regan LemmingWill Martin Daryl Beehler, Kathy Patel    History of Present Illness: Kathy Governancy [place] is a 64 y.o. female who presents today for pre AF ablation.  She has had multiple episodes of atrial fibrillation associated with fatigue, weakness, and palpitations.  She has agreed to AF ablatios today.  She has failed Multaq.   Today, she denies symptoms of palpitations, chest pain, shortness of breath, orthopnea, PND, lower extremity edema, claudication, dizziness, presyncope, syncope, bleeding, or neurologic sequela. The patient is tolerating medications without difficulties and is otherwise without complaint today.    Past Medical History  Diagnosis Date  . Hyperlipidemia    Past Surgical History  Procedure Laterality Date  . None      no history of surgery     No current facility-administered medications for this encounter.    Allergies:   Review of patient's allergies indicates no known allergies.   Social History:  The patient  reports that she has quit smoking. She does not have any smokeless tobacco history on file. She reports that she drinks alcohol. She reports that she does not use illicit drugs.   Family History:  The patient's family history includes Alzheimer's disease (age of onset: 3660) in her mother; Diabetes in her father; Heart failure in her maternal grandmother; Hyperlipidemia in her brother; Osteopenia in her sister; Osteoporosis in her mother.    ROS:  Please see the history of present illness.   All other systems are reviewed and negative.    PHYSICAL EXAM: VS:  BP 107/74 mmHg  Pulse 74  Temp(Src) 97.6 F (36.4 C) (Oral)  Resp 20  Ht 5\' 3"  (1.6 m)  Wt 120 lb (54.432 kg)  BMI 21.26 kg/m2  SpO2 98% , BMI Body mass index is 21.26 kg/(m^2). GEN: Well nourished, well developed, in no acute distress HEENT: normal Neck: no JVD, carotid  bruits, or masses Cardiac: RRR; no murmurs, rubs, or gallops,no edema  Respiratory:  clear to auscultation bilaterally, normal work of breathing GI: soft, nontender, nondistended, + BS MS: no deformity or atrophy Skin: warm and dry Neuro:  Strength and sensation are intact Psych: euthymic mood, full affect   Recent Labs: 05/06/2015: TSH 1.351 07/07/2015: BUN 11; Creat 0.70; Hemoglobin 13.4; Platelets 283; Potassium 4.5; Sodium 147*    Lipid Panel  No results found for: CHOL, TRIG, HDL, CHOLHDL, VLDL, LDLCALC, LDLDIRECT   Wt Readings from Last 3 Encounters:  07/15/15 120 lb (54.432 kg)  05/27/15 120 lb (54.432 kg)  05/17/15 123 lb (55.792 kg)     ASSESSMENT AND PLAN:  1.  1. Atrial fibrillation: Has tried Multaq but has had symptoms on that medication. Due to her high amount of AF, have put her on anticoagulation for now. Unfortunately for her, class I agents would not be good as she is bradycardic at baseline and would not be able to start a beta blocker along with the medication. Discussed other options for medications including amiodarone and tikosyn vs ablation. She would prefer ablation. Discussed risks and benefits, including bleeding, tamponade and stroke. risks include bleeding, infection, tamponade, and stroke, among others.  Labs/ tests ordered today include:  Orders Placed This Encounter  Procedures  . Diet NPO time specified Except for: Sips with Meds  . Informed consent details: transcribe and obtain patient signature  . Clip right and left femoral area PM before surgery  .  Clip right internal jugular area PM before surgery  . Verify informed consent  . Void on call to EP Lab  . Lab instructions  . AM of procedure hold 70/30 insulin dose (if ordered)  . AM of procedure hold oral hypoglycemic agents (if ordered)  . If patient on AM basal insulin: AM ON DAY OF PROCEDURE:  Give 1/2 basal dose (Lantus, Levemir, or NPH)  . EP Study  . EKG 12-Lead  . EKG  12-Lead  . Insert peripheral IV    Signed, Rozetta Stumpp Jorja Loa, Kathy Patel  07/15/2015 7:07 AM     [facility] [address] [place] [address] [phone] (office) [phone] (fax)

## 2015-07-15 NOTE — Anesthesia Postprocedure Evaluation (Signed)
Anesthesia Post Note  Patient: Kathy Patel  Procedure(s) Performed: Procedure(s) (LRB): Atrial Fibrillation Ablation (N/A)  Patient location during evaluation: PACU Anesthesia Type: MAC Level of consciousness: awake Pain management: pain level controlled Vital Signs Assessment: post-procedure vital signs reviewed and stable Respiratory status: spontaneous breathing Cardiovascular status: stable Anesthetic complications: no    Last Vitals:  Filed Vitals:   07/15/15 1630 07/15/15 1700  BP: 105/66 108/75  Pulse: 75 76  Temp:    Resp: 15 13    Last Pain: There were no vitals filed for this visit.               Ratto,Kalei Meda

## 2015-07-15 NOTE — Progress Notes (Signed)
9 FR sheath x 2 removed from right groin. Pressure held x 15 minutes and pt given instructions at this time. Site is a level zero and dressing has been applied. 7 FR and 11 FR sheath removed from left groin. Pressure held x 15 minutes and pt given instructions at this time. Site is a level zero and dressing has been applied.

## 2015-07-15 NOTE — Anesthesia Procedure Notes (Signed)
Procedure Name: MAC Date/Time: 07/15/2015 7:53 AM Performed by: Charm BargesBUTLER, Kathy Chappuis R Pre-anesthesia Checklist: Patient identified, Emergency Drugs available, Suction available, Patient being monitored and Timeout performed Patient Re-evaluated:Patient Re-evaluated prior to inductionOxygen Delivery Method: Nasal cannula Ventilation: Nasal airway inserted- appropriate to patient size Placement Confirmation: positive ETCO2 Dental Injury: Teeth and Oropharynx as per pre-operative assessment

## 2015-07-15 NOTE — Transfer of Care (Signed)
Immediate Anesthesia Transfer of Care Note  Patient: Kathy Patel  Procedure(s) Performed: Procedure(s): Atrial Fibrillation Ablation (N/A)  Patient Location: Cath Lab  Anesthesia Type:MAC  Level of Consciousness: awake, oriented and patient cooperative  Airway & Oxygen Therapy: Patient Spontanous Breathing and Patient connected to nasal cannula oxygen  Post-op Assessment: Report given to RN, Post -op Vital signs reviewed and stable and Patient moving all extremities  Post vital signs: Reviewed and stable  Last Vitals:  Filed Vitals:   07/15/15 0545  BP: 107/74  Pulse: 74  Temp: 36.4 C  Resp: 20    Complications: No apparent anesthesia complications

## 2015-07-16 ENCOUNTER — Encounter (HOSPITAL_COMMUNITY): Payer: Self-pay | Admitting: Physician Assistant

## 2015-07-16 DIAGNOSIS — Z7901 Long term (current) use of anticoagulants: Secondary | ICD-10-CM | POA: Diagnosis not present

## 2015-07-16 DIAGNOSIS — E785 Hyperlipidemia, unspecified: Secondary | ICD-10-CM | POA: Diagnosis not present

## 2015-07-16 DIAGNOSIS — R001 Bradycardia, unspecified: Secondary | ICD-10-CM | POA: Diagnosis not present

## 2015-07-16 DIAGNOSIS — I480 Paroxysmal atrial fibrillation: Secondary | ICD-10-CM | POA: Diagnosis not present

## 2015-07-16 LAB — BASIC METABOLIC PANEL
ANION GAP: 6 (ref 5–15)
BUN: 9 mg/dL (ref 6–20)
CHLORIDE: 103 mmol/L (ref 101–111)
CO2: 29 mmol/L (ref 22–32)
CREATININE: 0.67 mg/dL (ref 0.44–1.00)
Calcium: 8.5 mg/dL — ABNORMAL LOW (ref 8.9–10.3)
GFR calc non Af Amer: >60 mL/min (ref >60)
Glucose, Bld: 112 mg/dL — ABNORMAL HIGH (ref 65–99)
Potassium: 4 mmol/L (ref 3.5–5.1)
SODIUM: 138 mmol/L (ref 135–145)

## 2015-07-16 LAB — CBC
HEMATOCRIT: 34.8 % — AB (ref 36.0–46.0)
HEMOGLOBIN: 11.7 g/dL — AB (ref 12.0–15.0)
MCH: 31.1 pg (ref 26.0–34.0)
MCHC: 33.6 g/dL (ref 30.0–36.0)
MCV: 92.6 fL (ref 78.0–100.0)
Platelets: 206 10*3/uL (ref 150–400)
RBC: 3.76 MIL/uL — AB (ref 3.87–5.11)
RDW: 13.3 % (ref 11.5–15.5)
WBC: 6.7 10*3/uL (ref 4.0–10.5)

## 2015-07-16 MED ORDER — PANTOPRAZOLE SODIUM 40 MG PO TBEC: 40.0000 mg | DELAYED_RELEASE_TABLET | Freq: Every day | ORAL | Status: DC

## 2015-07-16 NOTE — Progress Notes (Signed)
SUBJECTIVE: The patient is doing well today.  At this time, she denies chest pain, shortness of breath, or any new concerns.  Had AF ablation done yesterday without issues.  Complaining of indigestion, was given Maalox without problems.  Marland Kitchen. apixaban  5 mg Oral BID  . atorvastatin  10 mg Oral Daily  . sodium chloride  3 mL Intravenous Q12H      OBJECTIVE: Physical Exam: Filed Vitals:   07/15/15 2000 07/15/15 2300 07/16/15 0300 07/16/15 0725  BP: 109/73 106/69 94/63 94/61   Pulse: 78  64 66  Temp:  98.5 F (36.9 C) 98 F (36.7 C) 98.5 F (36.9 C)  TempSrc:  Oral Oral Oral  Resp: 16 20 18 20   Height:      Weight:      SpO2: 100% 98% 99% 98%    Intake/Output Summary (Last 24 hours) at 07/16/15 0915 Last data filed at 07/16/15 0900  Gross per 24 hour  Intake   1343 ml  Output   1760 ml  Net   -417 ml    Telemetry reveals sinus rhythm  GEN- The patient is well appearing, alert and oriented x 3 today.   Head- normocephalic, atraumatic Eyes-  Sclera clear, conjunctiva pink Ears- hearing intact Oropharynx- clear Neck- supple, no JVP Lymph- no cervical lymphadenopathy Lungs- Clear to ausculation bilaterally, normal work of breathing Heart- Regular rate and rhythm, no murmurs, rubs or gallops, PMI not laterally displaced GI- soft, NT, ND, + BS Extremities- no clubbing, cyanosis, or edema Skin- no rash or lesion Psych- euthymic mood, full affect Neuro- strength and sensation are intact  LABS: Basic Metabolic Panel:  Recent Labs  16/04/9611/17/16 0301  NA 138  K 4.0  CL 103  CO2 29  GLUCOSE 112*  BUN 9  CREATININE 0.67  CALCIUM 8.5*   Liver Function Tests: No results for input(s): AST, ALT, ALKPHOS, BILITOT, PROT, ALBUMIN in the last 72 hours. No results for input(s): LIPASE, AMYLASE in the last 72 hours. CBC:  Recent Labs  07/16/15 0301  WBC 6.7  HGB 11.7*  HCT 34.8*  MCV 92.6  PLT 206   Cardiac Enzymes: No results for input(s): CKTOTAL, CKMB, CKMBINDEX,  TROPONINI in the last 72 hours. BNP: Invalid input(s): POCBNP D-Dimer: No results for input(s): DDIMER in the last 72 hours. Hemoglobin A1C: No results for input(s): HGBA1C in the last 72 hours. Fasting Lipid Panel: No results for input(s): CHOL, HDL, LDLCALC, TRIG, CHOLHDL, LDLDIRECT in the last 72 hours. Thyroid Function Tests: No results for input(s): TSH, T4TOTAL, T3FREE, THYROIDAB in the last 72 hours.  Invalid input(s): FREET3 Anemia Panel: No results for input(s): VITAMINB12, FOLATE, FERRITIN, TIBC, IRON, RETICCTPCT in the last 72 hours.  RADIOLOGY: Ct Heart Morp W/cta Cor W/score W/ca W/cm &/or Wo/cm  07/08/2015  ADDENDUM REPORT: 07/08/2015 14:06 ADDENDUM: 64 year old female with atrial fibrillation CLINICAL DATA:  Chest pain EXAM: Cardiac/Coronary  CT TECHNIQUE: The patient was scanned on a Philips 256 scanner. FINDINGS: A 120 kV prospective scan was triggered in the descending thoracic aorta at 111 HU's. Axial non-contrast 3 mm slices were carried out through the heart. The data set was analyzed on a dedicated work station and scored using the Agatson method. Gantry rotation speed was 270 msecs and collimation was .9 mm. No beta blockade and 0.4 mg of sl NTG was given. The 3D data set was reconstructed in 5% intervals of the 67-82 % of the R-R cycle. Diastolic phases were analyzed on a dedicated work station  using MPR, MIP and VRT modes. The patient received 80 cc of contrast. There are 4 pulmonary veins (two on the right and two on the left) that drain normally into the left atrium. The ostial measurements are as follows: RUPV:  23 x 18 mm RLPV:  14 x 13 mm LUPV:  17 x 11 mm LLPV:  18 x 9 mm Left atrium is severely dilated. The left atrial appendage is large, broccoli type with no filling defect. Esophagus in running in the midline not in the proximity to any of the pulmonary veins. Aorta:  Normal size, no calcifications or dissection. Aortic Valve:  Trileaflet with no calcifications.  Coronary Arteries:  Normal coronary origin.  Left dominance. Left main is a large artery that gives rise to LAD and LCX arteries. LAD is a large and long tortuous artery with no plaque. Diagonal branch is a medium caliber vessel with no plaque. LCX is a large dominant vessel that gives rise to OM1, PDA and PLA. There is no plaque. RCA is rather small non-dominant vessel with no plaque. IMPRESSION: 1. There are 4 pulmonary veins (two on the right and two on the left) that drain normally into the left atrium. 2. Left atrium is severely dilated. The left atrial appendage is large, broccoli type with no filling defect. 3. Esophagus in running in the midline not in the proximity to any of the pulmonary veins. 4. Coronary calcium score of 0. This was 0 percentile for age and sex matched control. 5. Normal coronary origin.  Left dominance.  No evidence of CAD. Tobias Alexander Electronically Signed   By: Tobias Alexander   On: 07/08/2015 14:06  07/08/2015  EXAM: OVER-READ INTERPRETATION  CT CHEST The following report is an over-read performed by radiologist Dr. Charline Bills of [facility] on 07/08/2015. This over-read does not include interpretation of cardiac or coronary anatomy or pathology. The coronary CTA interpretation by the cardiologist is attached. COMPARISON:  Chest radiographs dated 05/01/2010 FINDINGS: Mild dependent atelectasis in the bilateral lower lobes. Linear scarring/ atelectasis at the left lung base. No suspicious pulmonary nodules. No pleural effusion or pneumothorax. No suspicious mediastinal lymphadenopathy. Visualized upper abdomen is unremarkable. Mild degenerative changes of the mid thoracic spine. IMPRESSION: No significant extracardiac findings. Electronically Signed: By: Charline Bills M.D. On: 07/08/2015 11:06    ASSESSMENT AND PLAN:  Active Problems:   AF (paroxysmal atrial fibrillation) (HCC) Ablation done yesterday without issues.  Paulena Servais plan on discharge today on  Eliquis with protonix for one month.  Lennox Leikam follow up in AF clinic in one month for follow up.    Alaura Schippers Jorja Loa, MD 07/16/2015 9:15 AM

## 2015-07-16 NOTE — Discharge Summary (Signed)
CARDIOLOGY DISCHARGE SUMMARY   Patient ID: Kathy Patel MRN: [id number] DOB/AGE: 02-02-1951 64 y.o.  Admit date: 07/15/2015 Discharge date: 07/16/2015  PCP: Allean FoundSMITH,CANDACE THIELE, MD Primary Electrophysiologist: Dr. Elberta Fortisamnitz  Primary Discharge Diagnosis:  Paroxysmal atrial fibrillation Secondary Discharge Diagnosis:  Bradycardia Chronic anticoagulation with Eliquis, CHADS2VASC=1 (Female)  PROCEDURES: 1. Comprehensive electrophysiologic study. 2. Coronary sinus pacing and recording. 3. Three-dimensional mapping of atrial fibrillation 4. Ablation of atrial fibrillation with additional mapping  5. Intracardiac echocardiography. 6. Transseptal puncture of an intact septum.  Hospital Course: Kathy Governancy Mckenna is a 64 y.o. female with a history of paroxysmal atrial fibrillation. Unfortunately, her episodes are frequent and she is very symptomatic with them. Ablation was felt to be the best option and the patient agreed to the procedure. She came to the hospital for this on 07/15/2015.  Procedure results are below. The atrial fibrillation ablation was successful and she tolerated the procedure well. The sheaths were removed without difficulty.  On 07/16/2015, she was seen by Dr. Elberta Fortisamnitz and all data were reviewed. Her sites were without hematoma or bleeding. She was maintaining sinus rhythm. There is concern for reflux symptoms and she Ramone Gander be on Protonix for a month. No further inpatient workup was indicated and she is considered stable for discharge, to follow up as an outpatient.  Labs:   Lab Results  Component Value Date   WBC 6.7 07/16/2015   HGB 11.7* 07/16/2015   HCT 34.8* 07/16/2015   MCV 92.6 07/16/2015   PLT 206 07/16/2015     Recent Labs Lab 07/16/15 0301  NA 138  K 4.0  CL 103  CO2 29  BUN 9  CREATININE 0.67  CALCIUM 8.5*  GLUCOSE 112*    EP study: 07/15/2015 Measurements Following Ablation: In sinus rhythm with RR interval was 719 msec, with PR 134  msec, QRS 92 msec, and Qtc 373 msec. Following ablation the AH interval measured 59 msec with an HV interval of 45 msec. Ventricular pacing was performed, which revealed AV dissociation.  Rapid atrial pacing was performed, which revealed an AV Wenckebach cycle length of 330 msec.  AV node ERP was 600-260 ms. The procedure was therefore considered completed. All catheters were removed, and the sheaths were aspirated and flushed. The patient was transferred to the recovery area for sheath removal per protocol. EBL<5810ml. A limited bedside transthoracic echocardiogram revealed no pericardial effusion. There were no early apparent complications. CONCLUSIONS: 1. Sinus rhythm upon presentation.  2. Rotational Angiography reveals a moderate sized left atrium with four separate pulmonary veins without evidence of pulmonary vein stenosis. 3. Successful electrical isolation and anatomical encircling of all four pulmonary veins with radiofrequency current. 4. No inducible arrhythmias following ablation. 5. No early apparent complications. ICE imaging showed no evidence of pericardial effusion.  EKG: 07/16/2015 Sinus rhythm with sinus arrhythmia Vent. rate 68 BPM PR interval 136 ms QRS duration 88 ms QT/QTc 424/450 ms P-R-T axes 70 23 56   FOLLOW UP PLANS AND APPOINTMENTS No Known Allergies   Medication List    TAKE these medications        apixaban 5 MG Tabs tablet  Commonly known as:  ELIQUIS  Take 1 tablet (5 mg total) by mouth 2 (two) times daily.     atorvastatin 10 MG tablet  Commonly known as:  LIPITOR  Take 10 mg by mouth daily.     Biotin 10 MG Caps  Take 1 capsule by mouth 2 (two) times daily.     CALCIUM-VITAMIN  D PO  Take 1 capsule by mouth 2 (two) times daily.     pantoprazole 40 MG tablet  Commonly known as:  PROTONIX  Take 1 tablet (40 mg total) by mouth daily.     TURMERIC PO  Take 1 tablet by mouth 3 (three) times daily.        Discharge Instructions      Diet - low sodium heart healthy    Complete by:  As directed      Increase activity slowly    Complete by:  As directed           Follow-up Information    Follow up with CARROLL,DONNA, NP On 08/12/2015.   Specialties:  Nurse Practitioner, Cardiology   Why:  See provider at 11:00 AM, please read 15 minutes early for paperwork   Contact information:   [address] [place] [address] [phone]       BRING ALL MEDICATIONS WITH YOU TO FOLLOW UP APPOINTMENTS  Time spent with patient to include physician time: 33 min Signed: Theodore Demark, PA-C 07/16/2015, 10:24 AM Co-Sign MD  I have seen and examined this patient with Theodore Demark.  Agree with above, note added to reflect my findings.  On exam, regular rhythm no murmurs, lungs clear.  Had atrial fibrillation ablation without issues.  Went into AF during the procedure and converted to sinus rhythm with ablation of left pulmonary veins.  Dorian Renfro plan for follow up in AF clinic in one month then with me in 3 months.  Chiyeko Ferre continue anticoagulation with Eliquis.    Tory Mckissack M. Zamier Eggebrecht MD 07/19/2015 1:15 PM

## 2015-07-16 NOTE — Discharge Instructions (Signed)
PLEASE REMEMBER TO BRING ALL OF YOUR MEDICATIONS TO EACH OF YOUR FOLLOW-UP OFFICE VISITS.  PLEASE ATTEND ALL SCHEDULED FOLLOW-UP APPOINTMENTS.   Activity: Increase activity slowly as tolerated. You may shower, but no soaking baths (or swimming) for 1 week. No driving for 2 days. No lifting over 5 lbs for 1 week. No sexual activity for 1 week.   You May Return to Work: in 1 week (if applicable)  Wound Care: You may wash cath site gently with soap and water. Keep cath site clean and dry. If you notice pain, swelling, bleeding or pus at your cath site, please call [phone].    Site Care Refer to this sheet in the next few weeks. These instructions provide you with information on caring for yourself after your procedure. Your caregiver may also give you more specific instructions. Your treatment has been planned according to current medical practices, but problems sometimes occur. Call your caregiver if you have any problems or questions after your procedure. HOME CARE INSTRUCTIONS  You may shower 24 hours after the procedure. Remove the bandage (dressing) and gently wash the site with plain soap and water. Gently pat the site dry.   Do not apply powder or lotion to the site.   Do not sit in a bathtub, swimming pool, or whirlpool for 5 to 7 days.   No bending, squatting, or lifting anything over 10 pounds (4.5 kg) as directed by your caregiver.   Inspect the site at least twice daily.   Do not drive home if you are discharged the same day of the procedure. Have someone else drive you.   You may drive 24 hours after the procedure unless otherwise instructed by your caregiver.  What to expect:  Any bruising will usually fade within 1 to 2 weeks.   Blood that collects in the tissue (hematoma) may be painful to the touch. It should usually decrease in size and tenderness within 1 to 2 weeks.  SEEK IMMEDIATE MEDICAL CARE IF:  You have unusual pain at the site or down the affected limb.    You have redness, warmth, swelling, or pain at the site.   You have drainage (other than a small amount of blood on the dressing).   You have chills.   You have a fever or persistent symptoms for more than 72 hours.   You have a fever and your symptoms suddenly get worse.   Your leg becomes pale, cool, tingly, or numb.   You have heavy bleeding from the site. Hold pressure on the site.  Document Released: 08/18/2010 Document Revised: 07/05/2011 Document Reviewed: 08/18/2010 [facility] Patient Information 2012 [place], [place].

## 2015-07-18 ENCOUNTER — Encounter (HOSPITAL_COMMUNITY): Payer: Self-pay | Admitting: Cardiology

## 2015-07-19 ENCOUNTER — Encounter (HOSPITAL_COMMUNITY): Payer: Self-pay | Admitting: Cardiology

## 2015-07-21 ENCOUNTER — Telehealth: Payer: Self-pay | Admitting: Cardiology

## 2015-07-21 NOTE — Telephone Encounter (Signed)
New problem   Pt had ablation on 12.16.16 and now has a knot underneath where the cath was put in at the groin. Please need to speak to nurse.

## 2015-07-21 NOTE — Telephone Encounter (Signed)
Informed her this was not abnormal.  Explained reasoning for this to appear. Advised what to monitor for and when to call office if it got bigger/painful. Patient verbalized understanding and agreeable to plan.

## 2015-08-10 ENCOUNTER — Ambulatory Visit: Payer: BLUE CROSS/BLUE SHIELD | Admitting: Cardiology

## 2015-08-12 ENCOUNTER — Ambulatory Visit (HOSPITAL_COMMUNITY)
Admission: RE | Admit: 2015-08-12 | Discharge: 2015-08-12 | Disposition: A | Discharge status: Home / Self Care | Payer: BLUE CROSS/BLUE SHIELD | Source: Ambulatory Visit | Attending: Nurse Practitioner | Admitting: Nurse Practitioner

## 2015-08-12 ENCOUNTER — Ambulatory Visit (HOSPITAL_COMMUNITY): Payer: BLUE CROSS/BLUE SHIELD | Admitting: Nurse Practitioner

## 2015-08-12 ENCOUNTER — Encounter (HOSPITAL_COMMUNITY): Payer: Self-pay | Admitting: Nurse Practitioner

## 2015-08-12 VITALS — BP 106/64 | HR 78 | Ht 63.0 in | Wt 122.0 lb

## 2015-08-12 DIAGNOSIS — I480 Paroxysmal atrial fibrillation: Secondary | ICD-10-CM | POA: Diagnosis not present

## 2015-08-12 DIAGNOSIS — I4891 Unspecified atrial fibrillation: Secondary | ICD-10-CM | POA: Insufficient documentation

## 2015-08-12 MED ORDER — APIXABAN 5 MG PO TABS: 5.0000 mg | ORAL_TABLET | Freq: Two times a day (BID) | ORAL | Status: DC

## 2015-08-12 NOTE — Progress Notes (Signed)
Patient ID: [name], female   DOB: 02-04-1951, 65 y.o.   MRN: [id number]     Primary Care Physician: Allean Found, MD Referring Physician: Dr. Glennie Isle is a 65 y.o. female with a h/o afib s/p ablation 07/15/15. She denies any dysphagia, rt leg pain since procedure. She has not noticed any afib. Continues on apixaban without fail. She will be going to [place] today to stay with her daughter who is expecting their first grandchild and is preeclamptic. She is not sure how long she will have to be there since she is only 30 weeks.  Today, she denies symptoms of palpitations, chest pain, shortness of breath, orthopnea, PND, lower extremity edema, dizziness, presyncope, syncope, or neurologic sequela. The patient is tolerating medications without difficulties and is otherwise without complaint today.   Past Medical History  Diagnosis Date  . Hyperlipidemia   . PAF (paroxysmal atrial fibrillation) (HCC)     s/p ablation 07/15/2015; anticoagulation w/ Eliquis x 1 month   Past Surgical History  Procedure Laterality Date  . Electrophysiologic study  07/15/2015       . Atrial fibrillation ablation  07/15/2015  . Electrophysiologic study N/A 07/15/2015    Procedure: Atrial Fibrillation Ablation;  Surgeon: Will Jorja Loa, MD;  Location: [facility];  Service: Cardiovascular;  Laterality: N/A;    Current Outpatient Prescriptions  Medication Sig Dispense Refill  . atorvastatin (LIPITOR) 10 MG tablet Take 10 mg by mouth daily.    . Biotin 10 MG CAPS Take 1 capsule by mouth 2 (two) times daily.    Marland Kitchen CALCIUM-VITAMIN D PO Take 1 capsule by mouth 2 (two) times daily.    . TURMERIC PO Take 1 tablet by mouth 3 (three) times daily.    Marland Kitchen apixaban (ELIQUIS) 5 MG TABS tablet Take 1 tablet (5 mg total) by mouth 2 (two) times daily. 60 tablet 1  . pantoprazole (PROTONIX) 40 MG tablet Take 1 tablet (40 mg total) by mouth daily. (Patient not taking: Reported on 08/12/2015)  30 tablet 1   No current facility-administered medications for this encounter.    No Known Allergies  Social History   Social History  . Marital Status: Married    Spouse Name: N/A  . Number of Children: N/A  . Years of Education: N/A   Occupational History  . Not on file.   Social History Main Topics  . Smoking status: Former Games developer  . Smokeless tobacco: Not on file  . Alcohol Use: 0.0 oz/week    0 Standard drinks or equivalent per week  . Drug Use: No  . Sexual Activity: Not on file   Other Topics Concern  . Not on file   Social History Narrative    Family History  Problem Relation Age of Onset  . Alzheimer's disease Mother 48  . Osteoporosis Mother   . Diabetes Father   . Osteopenia Sister   . Hyperlipidemia Brother   . Heart failure Maternal Grandmother     ROS- All systems are reviewed and negative except as per the HPI above  Physical Exam: Filed Vitals:   08/12/15 1056  BP: 106/64  Pulse: 78  Height: 5\' 3"  (1.6 m)  Weight: 122 lb (55.339 kg)    GEN- The patient is well appearing, alert and oriented x 3 today.   Head- normocephalic, atraumatic Eyes-  Sclera clear, conjunctiva pink Ears- hearing intact Oropharynx- clear Neck- supple, no JVP Lymph- no cervical lymphadenopathy Lungs- Clear to ausculation bilaterally,  normal work of breathing Heart- Regular rate and rhythm, no murmurs, rubs or gallops, PMI not laterally displaced GI- soft, NT, ND, + BS Extremities- no clubbing, cyanosis, or edema MS- no significant deformity or atrophy Skin- no rash or lesion Psych- euthymic mood, full affect Neuro- strength and sensation are intact  EKG- NSR, pr int 120 ms, qrs int 84 ms, qtc 453 ms Epic records reviewed  Assessment and Plan: 1. afib S/p ablation and maintaining SR Continue apixaban , a separate rx was given with one refill was given to pt due to unknown time she may have to be out of state with her daughter She plans to restart yoga next  week as per Dr. Gershon Craneamnitz's recommendations to wait one month before resuming  F/u with Dr. Elberta Fortisamnitz 3/20

## 2015-08-23 ENCOUNTER — Other Ambulatory Visit: Payer: Self-pay | Admitting: Cardiology

## 2015-10-14 ENCOUNTER — Ambulatory Visit (HOSPITAL_COMMUNITY): Payer: BLUE CROSS/BLUE SHIELD | Admitting: Nurse Practitioner

## 2015-10-16 NOTE — Progress Notes (Signed)
Electrophysiology Office Note   Date:  10/16/2015   ID:  [name], DOB 1951-02-23, MRN [id number]  PCP:  Allean Found, MD  Primary Electrophysiologist:  Regan Lemming, MD    No chief complaint on file.    History of Present Illness: Kathy Patel is a 65 y.o. female who presents today for electrophysiology evaluation.   She had AF noted when she presented for colonoscopy.  She had multiple episodes of palpitations after that time, and underwent AF ablation on 07/15/15.  Since then, she has felt well without complaint.   Today, she denies symptoms of palpitations, chest pain, shortness of breath, orthopnea, PND, lower extremity edema, claudication, dizziness, presyncope, syncope, bleeding, or neurologic sequela. The patient is tolerating medications without difficulties and is otherwise without complaint today.    Past Medical History  Diagnosis Date  . Hyperlipidemia   . PAF (paroxysmal atrial fibrillation) (HCC)     s/p ablation 07/15/2015; anticoagulation w/ Eliquis x 1 month   Past Surgical History  Procedure Laterality Date  . Electrophysiologic study  07/15/2015       . Atrial fibrillation ablation  07/15/2015  . Electrophysiologic study N/A 07/15/2015    Procedure: Atrial Fibrillation Ablation;  Surgeon: Yuji Walth Jorja Loa, MD;  Location: [facility];  Service: Cardiovascular;  Laterality: N/A;     Current Outpatient Prescriptions  Medication Sig Dispense Refill  . apixaban (ELIQUIS) 5 MG TABS tablet Take 1 tablet (5 mg total) by mouth 2 (two) times daily. 60 tablet 1  . atorvastatin (LIPITOR) 10 MG tablet Take 10 mg by mouth daily.    . Biotin 10 MG CAPS Take 1 capsule by mouth 2 (two) times daily.    Marland Kitchen CALCIUM-VITAMIN D PO Take 1 capsule by mouth 2 (two) times daily.    . pantoprazole (PROTONIX) 40 MG tablet Take 1 tablet (40 mg total) by mouth daily. (Patient not taking: Reported on 08/12/2015) 30 tablet 1  . TURMERIC PO Take 1 tablet by  mouth 3 (three) times daily.     No current facility-administered medications for this visit.    Allergies:   Review of patient's allergies indicates no known allergies.   Social History:  The patient  reports that she has quit smoking. She does not have any smokeless tobacco history on file. She reports that she drinks alcohol. She reports that she does not use illicit drugs.   Family History:  The patient's family history includes Alzheimer's disease (age of onset: 71) in her mother; Diabetes in her father; Heart failure in her maternal grandmother; Hyperlipidemia in her brother; Osteopenia in her sister; Osteoporosis in her mother.    ROS:  Please see the history of present illness.   Otherwise, review of systems is positive for none.   All other systems are reviewed and negative.    PHYSICAL EXAM: VS:  There were no vitals taken for this visit. , BMI There is no weight on file to calculate BMI. GEN: Well nourished, well developed, in no acute distress HEENT: normal Neck: no JVD, carotid bruits, or masses Cardiac: RRR; no murmurs, rubs, or gallops,no edema  Respiratory:  clear to auscultation bilaterally, normal work of breathing GI: soft, nontender, nondistended, + BS MS: no deformity or atrophy Skin: warm and dry Neuro:  Strength and sensation are intact Psych: euthymic mood, full affect  EKG:  EKG is ordered today. The ekg ordered today shows sinus bradycardia, rate 59   Recent Labs: 05/06/2015: TSH 1.351 07/16/2015: BUN  9; Creatinine, Ser 0.67; Hemoglobin 11.7*; Platelets 206; Potassium 4.0; Sodium 138    Lipid Panel  No results found for: CHOL, TRIG, HDL, CHOLHDL, VLDL, LDLCALC, LDLDIRECT   Wt Readings from Last 3 Encounters:  08/12/15 122 lb (55.339 kg)  07/15/15 120 lb (54.432 kg)  05/27/15 120 lb (54.432 kg)      Other studies Reviewed: Additional studies/ records that were reviewed today include: TTE  Review of the above records today demonstrates:  - LVEF  55-60%, normal wall thickness, normal wall motion, mild MR, moderate LAE, moderate TR, RVSP 37 mmHg, normal IVC.    ASSESSMENT AND PLAN:  1.  Atrial fibrillation:  Had successful ablation 3 months ago.   CHADS2VASc of 1.  She is on Eliquis and feels that she should continue this.  i have told her that since her CHADS2VASc is low, she can stop her eliquis for high risk activities such as bike riding without issues.  She Kathy Patel need to be on Eliquis daily when she turns 66.   Current medicines are reviewed at length with the patient today.   The patient does not have concerns regarding her medicines.  The following changes were made today:  none  Labs/ tests ordered today include:  No orders of the defined types were placed in this encounter.     Disposition:   FU with Kathy Patel 6 months  Signed, Kathy Patel Jorja LoaMartin Knoxx Boeding, MD  10/16/2015 8:57 PM     [facility] [address] [place] [address] [phone] (office) [phone] (fax)

## 2015-10-17 ENCOUNTER — Ambulatory Visit (INDEPENDENT_AMBULATORY_CARE_PROVIDER_SITE_OTHER): Payer: BLUE CROSS/BLUE SHIELD | Admitting: Cardiology

## 2015-10-17 ENCOUNTER — Encounter: Payer: Self-pay | Admitting: Cardiology

## 2015-10-17 VITALS — BP 110/70 | HR 59 | Ht 63.0 in | Wt 125.2 lb

## 2015-10-17 DIAGNOSIS — Z8679 Personal history of other diseases of the circulatory system: Secondary | ICD-10-CM

## 2015-10-17 DIAGNOSIS — I480 Paroxysmal atrial fibrillation: Secondary | ICD-10-CM

## 2015-10-17 DIAGNOSIS — Z9889 Other specified postprocedural states: Secondary | ICD-10-CM | POA: Diagnosis not present

## 2015-10-17 MED ORDER — APIXABAN 5 MG PO TABS: 5.0000 mg | ORAL_TABLET | Freq: Two times a day (BID) | ORAL | Status: DC

## 2015-10-17 NOTE — Patient Instructions (Addendum)
Medication Instructions:    Your physician recommends that you continue on your current medications as directed. Please refer to the Current Medication list given to you today.  Labwork:  None ordered  Testing/Procedures:  None ordered  Follow-Up:  Your physician wants you to follow-up in: 6 months with Dr. Camnitz.  You will receive a reminder letter in the mail two months in advance. If you don't receive a letter, please call our office to schedule the follow-up appointment.  - If you need a refill on your cardiac medications before your next appointment, please call your pharmacy.    Thank you for choosing [facility]!!   Nijah Tejera, RN [phone]         

## 2015-12-14 ENCOUNTER — Telehealth: Payer: Self-pay | Admitting: Cardiology

## 2015-12-14 NOTE — Telephone Encounter (Signed)
Discussed anticoagulation with patient.  Patient with CHADS2VASc of 1 for gender.  Told her that she can stop her anticoagulation for her AF as she is at low risk for CVA.  She understands and agrees with the plan.  Loman BrooklynWill Camnitz, MD

## 2015-12-27 ENCOUNTER — Other Ambulatory Visit: Payer: Self-pay | Admitting: Family Medicine

## 2015-12-27 DIAGNOSIS — R519 Headache, unspecified: Secondary | ICD-10-CM

## 2015-12-27 DIAGNOSIS — R51 Headache: Principal | ICD-10-CM

## 2016-01-03 ENCOUNTER — Ambulatory Visit
Admission: RE | Admit: 2016-01-03 | Discharge: 2016-01-03 | Disposition: A | Discharge status: Home / Self Care | Payer: BLUE CROSS/BLUE SHIELD | Source: Ambulatory Visit | Attending: Family Medicine | Admitting: Family Medicine

## 2016-01-03 DIAGNOSIS — R51 Headache: Principal | ICD-10-CM

## 2016-01-03 DIAGNOSIS — R519 Headache, unspecified: Secondary | ICD-10-CM

## 2016-01-03 MED ORDER — GADOBENATE DIMEGLUMINE 529 MG/ML IV SOLN
11.0000 mL | Freq: Once | INTRAVENOUS | Status: AC | PRN
  Administered 2016-01-03: 11 mL via INTRAVENOUS

## 2016-05-02 ENCOUNTER — Other Ambulatory Visit: Payer: Self-pay | Admitting: Cardiology

## 2016-05-02 ENCOUNTER — Encounter: Payer: Self-pay | Admitting: *Deleted

## 2016-05-02 ENCOUNTER — Encounter: Payer: Self-pay | Admitting: Cardiology

## 2016-05-02 ENCOUNTER — Ambulatory Visit (INDEPENDENT_AMBULATORY_CARE_PROVIDER_SITE_OTHER): Payer: No Typology Code available for payment source | Admitting: Cardiology

## 2016-05-02 VITALS — BP 108/72 | HR 85 | Ht 63.0 in | Wt 119.2 lb

## 2016-05-02 DIAGNOSIS — I480 Paroxysmal atrial fibrillation: Secondary | ICD-10-CM | POA: Diagnosis not present

## 2016-05-02 DIAGNOSIS — Z01812 Encounter for preprocedural laboratory examination: Secondary | ICD-10-CM

## 2016-05-02 NOTE — Patient Instructions (Addendum)
Medication Instructions:  Your physician has recommended you make the following change in your medication:   1) START Eliquis 5 mg twice daily  --- start this medication on 06/05/2016.  --- If you need a refill on your cardiac medications before your next appointment, please call your pharmacy. ---  Labwork:  None ordered  Testing/Procedures:  Your physician has requested that you have cardiac CT. Cardiac computed tomography (CT) is a painless test that uses an x-ray machine to take clear, detailed pictures of your heart. For further information please visit [email or web address].   We will call you to arrange this testing.  Your physician has recommended that you have a repeat AFib ablation. Catheter ablation is a medical procedure used to treat some cardiac arrhythmias (irregular heartbeats). During catheter ablation, a long, thin, flexible tube is put into a blood vessel in your groin (upper thigh), or neck. This tube is called an ablation catheter. It is then guided to your heart through the blood vessel. Radio frequency waves destroy small areas of heart tissue where abnormal heartbeats may cause an arrhythmia to start. Please see the instruction sheet given to you today.  Follow-Up:  Your physician recommends that you schedule a follow-up appointment in: weeks of 06/13/16-06/26/16, with Dr. Elberta Fortisamnitz for an H&P and lab work prior to your procedure.   Your physician recommends that you schedule a follow-up with Rudi Cocoonna Carroll, NP in the AFib clinic on   Your physician recommends that you schedule a follow-up appointment in: 3 months, after your procedure on 06/27/2016, with Dr. Elberta Fortisamnitz.  Thank you for choosing [facility]!!   Dory HornSherri Terryann Verbeek, RN [phone]

## 2016-05-02 NOTE — Progress Notes (Signed)
Electrophysiology Office Note   Date:  05/02/2016   ID:  Kathy Patel, DOB [birth year], MRN [id number]  PCP:  Allean Found, MD  Primary Electrophysiologist:  Regan Lemming, MD    Chief Complaint  Patient presents with  . Follow-up    afib     History of Present Illness: Kathy Patel is a 65 y.o. female who presents today for electrophysiology evaluation.   She had AF noted when she presented for colonoscopy.  She had multiple episodes of palpitations after that time, and underwent AF ablation on 07/15/15.  She had felt well up until last night when she had a proximally to a half hours of atrial fibrillation. She says that she did nothing new yesterday. She has continued to be active and has not started any new medications. She says that she felt palpitations, and took her pulse, along with her husband taking her pulse, which was found to be tachycardic and regular. The episode lasted approximately 2 and half hours and she reverted back to normal rhythm.  Today, she denies symptoms of palpitations, chest pain, shortness of breath, orthopnea, PND, lower extremity edema, claudication, dizziness, presyncope, syncope, bleeding, or neurologic sequela. The patient is tolerating medications without difficulties and is otherwise without complaint today.    Past Medical History:  Diagnosis Date  . Hyperlipidemia   . PAF (paroxysmal atrial fibrillation) (HCC)    s/p ablation 07/15/2015; anticoagulation w/ Eliquis x 1 month   Past Surgical History:  Procedure Laterality Date  . ATRIAL FIBRILLATION ABLATION  07/15/2015  . ELECTROPHYSIOLOGIC STUDY  07/15/2015      . ELECTROPHYSIOLOGIC STUDY N/A 07/15/2015   Procedure: Atrial Fibrillation Ablation;  Surgeon: Will Jorja Loa, MD;  [facility];  Service: Cardiovascular;  Laterality: N/A;     Current Outpatient Prescriptions  Medication Sig Dispense Refill  . apixaban (ELIQUIS) 5 MG TABS tablet Take 1  tablet (5 mg total) by mouth 2 (two) times daily. 180 tablet 2  . atorvastatin (LIPITOR) 10 MG tablet Take 10 mg by mouth daily.    . Biotin 10 MG CAPS Take 1 capsule by mouth 2 (two) times daily.    Marland Kitchen CALCIUM-VITAMIN D PO Take 1 capsule by mouth 2 (two) times daily.    . TURMERIC PO Take 1 tablet by mouth 3 (three) times daily.     No current facility-administered medications for this visit.     Allergies:   Review of patient's allergies indicates no known allergies.   Social History:  The patient  reports that she has quit smoking. She does not have any smokeless tobacco history on file. She reports that she drinks alcohol. She reports that she does not use drugs.   Family History:  The patient's family history includes Alzheimer's disease (age of onset: 94) in her mother; Diabetes in her father; Heart failure in her maternal grandmother; Hyperlipidemia in her brother; Osteopenia in her sister; Osteoporosis in her mother.    ROS:  Please see the history of present illness.   Otherwise, review of systems is positive for none.   All other systems are reviewed and negative.    PHYSICAL EXAM: VS:  BP 108/72   Pulse 85   Ht 5\' 3"  (1.6 m)   Wt 119 lb 3.2 oz (54.1 kg)   BMI 21.12 kg/m  , BMI Body mass index is 21.12 kg/m. GEN: Well nourished, well developed, in no acute distress  HEENT: normal  Neck: no JVD, carotid bruits, or  masses Cardiac: RRR; no murmurs, rubs, or gallops,no edema  Respiratory:  clear to auscultation bilaterally, normal work of breathing GI: soft, nontender, nondistended, + BS MS: no deformity or atrophy  Skin: warm and dry Neuro:  Strength and sensation are intact Psych: euthymic mood, full affect  EKG:  EKG is ordered today. The ekg ordered today shows ectopic atrial rhythm, rate 85   Recent Labs: 05/06/2015: TSH 1.351 07/16/2015: BUN 9; Creatinine, Ser 0.67; Hemoglobin 11.7; Platelets 206; Potassium 4.0; Sodium 138    Lipid Panel  No results found for:  CHOL, TRIG, HDL, CHOLHDL, VLDL, LDLCALC, LDLDIRECT   Wt Readings from Last 3 Encounters:  05/02/16 119 lb 3.2 oz (54.1 kg)  10/17/15 125 lb 3.2 oz (56.8 kg)  08/12/15 122 lb (55.3 kg)      Other studies Reviewed: Additional studies/ records that were reviewed today include: TTE  Review of the above records today demonstrates:  - LVEF 55-60%, normal wall thickness, normal wall motion, mild MR, moderate LAE, moderate TR, RVSP 37 mmHg, normal IVC.    ASSESSMENT AND PLAN:  1.  Atrial fibrillation:  Had successful ablation 3 months ago.  Unfortunately, she has had recurrence of her atrial fibrillation. We did discuss the risks and benefits of both medical management versus ablation. Risks of ablation include bleeding, tamponade, heart block, stroke, damage to surrounding organs. She understands these risks and would like to have a repeat ablation performed.  CHADS2VASc of 1.  I have given her a repeat prescription for Eliquis to take around the time of her ablation. She is very nervous about her stroke risk, and says that she will take an extra dose of Eliquis if she does go into atrial fibrillation, as she is quite symptomatic.   Current medicines are reviewed at length with the patient today.   The patient does not have concerns regarding her medicines.  The following changes were made today:  none  Labs/ tests ordered today include:  No orders of the defined types were placed in this encounter.    Disposition:   FU with Will Camnitz 6 months  Signed, Will Jorja LoaMartin Camnitz, MD  05/02/2016 9:59 AM     [facility] [address] [place] [address] [phone] (office) [phone] (fax)

## 2016-05-07 ENCOUNTER — Telehealth: Payer: Self-pay | Admitting: Cardiology

## 2016-05-07 NOTE — Telephone Encounter (Signed)
Dr.Kellenberger says he has some questions abouthis wife , States its not an emergency , can call after he is done with patients or tomorrow at anytime . Please call on cel number of [phone]  Thanks

## 2016-05-08 NOTE — Telephone Encounter (Signed)
Dr. Elberta Fortisamnitz called and spoke with husband.

## 2016-05-09 ENCOUNTER — Encounter: Payer: Self-pay | Admitting: Cardiology

## 2016-06-20 ENCOUNTER — Other Ambulatory Visit (INDEPENDENT_AMBULATORY_CARE_PROVIDER_SITE_OTHER): Payer: No Typology Code available for payment source

## 2016-06-20 ENCOUNTER — Ambulatory Visit: Payer: No Typology Code available for payment source | Admitting: Cardiology

## 2016-06-20 DIAGNOSIS — I480 Paroxysmal atrial fibrillation: Secondary | ICD-10-CM | POA: Diagnosis not present

## 2016-06-20 DIAGNOSIS — Z01812 Encounter for preprocedural laboratory examination: Secondary | ICD-10-CM

## 2016-06-21 LAB — CBC WITH DIFFERENTIAL/PLATELET
BASOS ABS: 34 {cells}/uL (ref 0–200)
Basophils Relative: 1 %
EOS ABS: 68 {cells}/uL (ref 15–500)
Eosinophils Relative: 2 %
HCT: 37 % (ref 35.0–45.0)
HEMOGLOBIN: 12.2 g/dL (ref 11.7–15.5)
LYMPHS ABS: 1360 {cells}/uL (ref 850–3900)
Lymphocytes Relative: 40 %
MCH: 30.3 pg (ref 27.0–33.0)
MCHC: 33 g/dL (ref 32.0–36.0)
MCV: 92 fL (ref 80.0–100.0)
MONO ABS: 374 {cells}/uL (ref 200–950)
MPV: 9.2 fL (ref 7.5–12.5)
Monocytes Relative: 11 %
NEUTROS ABS: 1564 {cells}/uL (ref 1500–7800)
Neutrophils Relative %: 46 %
Platelets: 280 10*3/uL (ref 140–400)
RBC: 4.02 MIL/uL (ref 3.80–5.10)
RDW: 13.4 % (ref 11.0–15.0)
WBC: 3.4 10*3/uL — ABNORMAL LOW (ref 3.8–10.8)

## 2016-06-21 LAB — BASIC METABOLIC PANEL
BUN: 17 mg/dL (ref 7–25)
CHLORIDE: 101 mmol/L (ref 98–110)
CO2: 28 mmol/L (ref 20–31)
Calcium: 8.8 mg/dL (ref 8.6–10.4)
Creat: 0.77 mg/dL (ref 0.50–0.99)
Glucose, Bld: 84 mg/dL (ref 65–99)
POTASSIUM: 4 mmol/L (ref 3.5–5.3)
SODIUM: 138 mmol/L (ref 135–146)

## 2016-06-22 ENCOUNTER — Encounter (HOSPITAL_COMMUNITY): Payer: Self-pay | Admitting: *Deleted

## 2016-06-25 ENCOUNTER — Encounter (HOSPITAL_COMMUNITY): Payer: Self-pay

## 2016-06-25 ENCOUNTER — Ambulatory Visit (HOSPITAL_COMMUNITY)
Admission: RE | Admit: 2016-06-25 | Discharge: 2016-06-25 | Disposition: A | Discharge status: Home / Self Care | Payer: No Typology Code available for payment source | Source: Ambulatory Visit | Attending: Cardiology | Admitting: Cardiology

## 2016-06-25 DIAGNOSIS — I480 Paroxysmal atrial fibrillation: Secondary | ICD-10-CM | POA: Diagnosis not present

## 2016-06-25 DIAGNOSIS — I4891 Unspecified atrial fibrillation: Secondary | ICD-10-CM | POA: Diagnosis not present

## 2016-06-25 MED ORDER — IOPAMIDOL (ISOVUE-370) INJECTION 76%
INTRAVENOUS | Status: AC
  Administered 2016-06-25: 80 mL
  Filled 2016-06-25: qty 100

## 2016-06-25 NOTE — Progress Notes (Signed)
CT heart complete, no meds given, pt d/c home

## 2016-06-26 ENCOUNTER — Encounter: Payer: Self-pay | Admitting: Cardiology

## 2016-06-26 ENCOUNTER — Ambulatory Visit (INDEPENDENT_AMBULATORY_CARE_PROVIDER_SITE_OTHER): Payer: No Typology Code available for payment source | Admitting: Cardiology

## 2016-06-26 VITALS — BP 100/62 | HR 60 | Ht 63.0 in | Wt 120.2 lb

## 2016-06-26 DIAGNOSIS — I480 Paroxysmal atrial fibrillation: Secondary | ICD-10-CM

## 2016-06-26 NOTE — Progress Notes (Signed)
Electrophysiology Office Note   Date:  06/26/2016   ID:  Kathy Patel, DOB January 23, 1951, MRN [id number]  PCP:  Allean FoundSMITH,CANDACE THIELE, MD  Primary Electrophysiologist:  Regan LemmingWill Martin Camnitz, MD    Chief Complaint  Patient presents with  . Follow-up    PAF/H&P     History of Present Illness: Kathy Patel is a 65 y.o. female who presents today for electrophysiology evaluation.   She had AF noted when she presented for colonoscopy.  She had multiple episodes of palpitations after that time, and underwent AF ablation on 07/15/15.  She had felt well up until last night when she had a proximally to a half hours of atrial fibrillation. She says that she did nothing new yesterday. She has continued to be active and has not started any new medications. She is scheduled for repeat AF ablation 03/27/16.  Today, she denies symptoms of palpitations, chest pain, shortness of breath, orthopnea, PND, lower extremity edema, claudication, dizziness, presyncope, syncope, bleeding, or neurologic sequela. The patient is tolerating medications without difficulties and is otherwise without complaint today.    Past Medical History:  Diagnosis Date  . Hyperlipidemia   . PAF (paroxysmal atrial fibrillation) (HCC)    s/p ablation 07/15/2015; anticoagulation w/ Eliquis x 1 month   Past Surgical History:  Procedure Laterality Date  . ATRIAL FIBRILLATION ABLATION  07/15/2015  . ELECTROPHYSIOLOGIC STUDY  07/15/2015      . ELECTROPHYSIOLOGIC STUDY N/A 07/15/2015   Procedure: Atrial Fibrillation Ablation;  Surgeon: Will Jorja LoaMartin Camnitz, MD;  Location: [facility];  Service: Cardiovascular;  Laterality: N/A;     Current Outpatient Prescriptions  Medication Sig Dispense Refill  . apixaban (ELIQUIS) 5 MG TABS tablet Take 1 tablet (5 mg total) by mouth 2 (two) times daily. 180 tablet 2  . atorvastatin (LIPITOR) 10 MG tablet Take 10 mg by mouth daily.    . Biotin 10 MG CAPS Take 1 capsule by mouth 2 (two)  times daily.    Marland Kitchen. CALCIUM-VITAMIN D PO Take 1 capsule by mouth daily. Will stop prior to procedure    . TURMERIC PO Take 1 tablet by mouth 2 (two) times daily.      No current facility-administered medications for this visit.     Allergies:   Patient has no known allergies.   Social History:  The patient  reports that she has quit smoking. She does not have any smokeless tobacco history on file. She reports that she drinks alcohol. She reports that she does not use drugs.   Family History:  The patient's family history includes Alzheimer's disease (age of onset: 1060) in her mother; Diabetes in her father; Heart failure in her maternal grandmother; Hyperlipidemia in her brother; Osteopenia in her sister; Osteoporosis in her mother.    ROS:  Please see the history of present illness.   Otherwise, review of systems is positive for none.   All other systems are reviewed and negative.    PHYSICAL EXAM: VS:  BP 100/62   Pulse 60   Ht 5\' 3"  (1.6 m)   Wt 120 lb 3.2 oz (54.5 kg)   BMI 21.29 kg/m  , BMI Body mass index is 21.29 kg/m. GEN: Well nourished, well developed, in no acute distress  HEENT: normal  Neck: no JVD, carotid bruits, or masses Cardiac: RRR; no murmurs, rubs, or gallops,no edema  Respiratory:  clear to auscultation bilaterally, normal work of breathing GI: soft, nontender, nondistended, + BS MS: no deformity or atrophy  Skin: warm and dry Neuro:  Strength and sensation are intact Psych: euthymic mood, full affect  EKG:  EKG is not ordered today. The ekg ordered 10/4/17shows ectopic atrial rhythm, rate 85   Recent Labs: 06/20/2016: BUN 17; Creat 0.77; Hemoglobin 12.2; Platelets 280; Potassium 4.0; Sodium 138    Lipid Panel  No results found for: CHOL, TRIG, HDL, CHOLHDL, VLDL, LDLCALC, LDLDIRECT   Wt Readings from Last 3 Encounters:  06/26/16 120 lb 3.2 oz (54.5 kg)  05/02/16 119 lb 3.2 oz (54.1 kg)  10/17/15 125 lb 3.2 oz (56.8 kg)      Other studies  Reviewed: Additional studies/ records that were reviewed today include: TTE  Review of the above records today demonstrates:  - LVEF 55-60%, normal wall thickness, normal wall motion, mild MR, moderate LAE, moderate TR, RVSP 37 mmHg, normal IVC.    ASSESSMENT AND PLAN:  1.  Atrial fibrillation:  Had successful ablation 3 months ago.  Unfortunately, she has had recurrence of her atrial fibrillation. We did discuss the risks and benefits of both medical management versus ablation. Risks of ablation include bleeding, tamponade, heart block, stroke, damage to surrounding organs. Repeat ablation scheduled for 06/27/16.  CHADS2VASc of 1.  She has been compliant with her Eliquis. CT scan shows improvement in her left atrial size which is decreased from severely enlarged to moderately enlarged.   Current medicines are reviewed at length with the patient today.   The patient does not have concerns regarding her medicines.  The following changes were made today:  none  Labs/ tests ordered today include:  No orders of the defined types were placed in this encounter.    Disposition:   FU with Will Camnitz 3 months  Signed, Will Jorja LoaMartin Camnitz, MD  06/26/2016 11:26 AM     [facility] [address] [place] [address] [phone] (office) [phone] (fax)

## 2016-06-26 NOTE — Patient Instructions (Signed)
Medication Instructions:    Your physician recommends that you continue on your current medications as directed. Please refer to the Current Medication list given to you today.  --- If you need a refill on your cardiac medications before your next appointment, please call your pharmacy. ---  Labwork:  None ordered  Testing/Procedures:  None ordered  Follow-Up:  Keep your post ablation follow up appointments  Thank you for choosing [facility]!!   Dory HornSherri Price, RN [phone]

## 2016-06-27 ENCOUNTER — Encounter (HOSPITAL_COMMUNITY)
Admission: RE | Disposition: A | Discharge status: Home / Self Care | Payer: Self-pay | Source: Ambulatory Visit | Attending: Cardiology

## 2016-06-27 ENCOUNTER — Ambulatory Visit (HOSPITAL_COMMUNITY): Payer: No Typology Code available for payment source | Admitting: Anesthesiology

## 2016-06-27 ENCOUNTER — Ambulatory Visit (HOSPITAL_COMMUNITY)
Admission: RE | Admit: 2016-06-27 | Discharge: 2016-06-28 | Disposition: A | Discharge status: Home / Self Care | Payer: No Typology Code available for payment source | Source: Ambulatory Visit | Attending: Cardiology | Admitting: Cardiology

## 2016-06-27 DIAGNOSIS — Z7901 Long term (current) use of anticoagulants: Secondary | ICD-10-CM | POA: Diagnosis not present

## 2016-06-27 DIAGNOSIS — I480 Paroxysmal atrial fibrillation: Secondary | ICD-10-CM | POA: Insufficient documentation

## 2016-06-27 DIAGNOSIS — Z79899 Other long term (current) drug therapy: Secondary | ICD-10-CM | POA: Diagnosis not present

## 2016-06-27 DIAGNOSIS — I4891 Unspecified atrial fibrillation: Secondary | ICD-10-CM | POA: Diagnosis present

## 2016-06-27 DIAGNOSIS — E785 Hyperlipidemia, unspecified: Secondary | ICD-10-CM | POA: Insufficient documentation

## 2016-06-27 DIAGNOSIS — Z87891 Personal history of nicotine dependence: Secondary | ICD-10-CM | POA: Insufficient documentation

## 2016-06-27 HISTORY — PX: ELECTROPHYSIOLOGIC STUDY: SHX172A

## 2016-06-27 LAB — POCT ACTIVATED CLOTTING TIME
ACTIVATED CLOTTING TIME: 307 s
ACTIVATED CLOTTING TIME: 323 s
ACTIVATED CLOTTING TIME: 125 s

## 2016-06-27 SURGERY — ATRIAL FIBRILLATION ABLATION
Anesthesia: General

## 2016-06-27 MED ORDER — BUPIVACAINE HCL (PF) 0.25 % IJ SOLN
INTRAMUSCULAR | Status: AC
  Filled 2016-06-27: qty 30

## 2016-06-27 MED ORDER — ONDANSETRON HCL 4 MG/2ML IJ SOLN: 4.0000 mg | Freq: Four times a day (QID) | INTRAMUSCULAR | Status: DC | PRN

## 2016-06-27 MED ORDER — ACETAMINOPHEN 325 MG PO TABS
650.0000 mg | ORAL_TABLET | ORAL | Status: DC | PRN
  Administered 2016-06-28: 650 mg via ORAL
  Filled 2016-06-27: qty 2

## 2016-06-27 MED ORDER — PROPOFOL 10 MG/ML IV BOLUS
INTRAVENOUS | Status: DC | PRN
  Administered 2016-06-27: 100 mg via INTRAVENOUS

## 2016-06-27 MED ORDER — ISOPROTERENOL HCL 0.2 MG/ML IJ SOLN
INTRAMUSCULAR | Status: AC
  Filled 2016-06-27: qty 5

## 2016-06-27 MED ORDER — APIXABAN 5 MG PO TABS
5.0000 mg | ORAL_TABLET | Freq: Two times a day (BID) | ORAL | Status: DC
  Administered 2016-06-27 – 2016-06-28 (×2): 5 mg via ORAL
  Filled 2016-06-27 (×2): qty 1

## 2016-06-27 MED ORDER — FENTANYL CITRATE (PF) 100 MCG/2ML IJ SOLN
25.0000 ug | Freq: Once | INTRAMUSCULAR | Status: AC
  Administered 2016-06-27: 25 ug via INTRAVENOUS

## 2016-06-27 MED ORDER — DIPHENHYDRAMINE HCL 25 MG PO CAPS
25.0000 mg | ORAL_CAPSULE | Freq: Every evening | ORAL | Status: DC | PRN
  Administered 2016-06-27: 25 mg via ORAL
  Filled 2016-06-27: qty 1

## 2016-06-27 MED ORDER — SODIUM CHLORIDE 0.9 % IV SOLN
INTRAVENOUS | Status: DC | PRN
  Administered 2016-06-27: 09:00:00 via INTRAVENOUS

## 2016-06-27 MED ORDER — ADENOSINE 6 MG/2ML IV SOLN
INTRAVENOUS | Status: AC
  Filled 2016-06-27: qty 8

## 2016-06-27 MED ORDER — HEPARIN SODIUM (PORCINE) 1000 UNIT/ML IJ SOLN
INTRAMUSCULAR | Status: DC | PRN
  Administered 2016-06-27: 11000 [IU] via INTRAVENOUS
  Administered 2016-06-27: 1000 [IU] via INTRAVENOUS

## 2016-06-27 MED ORDER — BUPIVACAINE HCL (PF) 0.25 % IJ SOLN
INTRAMUSCULAR | Status: DC | PRN
  Administered 2016-06-27: 38 mL

## 2016-06-27 MED ORDER — PHENYLEPHRINE HCL 10 MG/ML IJ SOLN
INTRAVENOUS | Status: DC | PRN
  Administered 2016-06-27: 25 ug/min via INTRAVENOUS

## 2016-06-27 MED ORDER — FENTANYL CITRATE (PF) 100 MCG/2ML IJ SOLN
INTRAMUSCULAR | Status: DC | PRN
  Administered 2016-06-27 (×2): 50 ug via INTRAVENOUS

## 2016-06-27 MED ORDER — ATORVASTATIN CALCIUM 10 MG PO TABS
10.0000 mg | ORAL_TABLET | Freq: Every day | ORAL | Status: DC
  Administered 2016-06-27: 10 mg via ORAL
  Filled 2016-06-27 (×2): qty 1

## 2016-06-27 MED ORDER — HEPARIN (PORCINE) IN NACL 2-0.9 UNIT/ML-% IJ SOLN
INTRAMUSCULAR | Status: AC
  Filled 2016-06-27: qty 500

## 2016-06-27 MED ORDER — HEPARIN SODIUM (PORCINE) 1000 UNIT/ML IJ SOLN
INTRAMUSCULAR | Status: AC
  Filled 2016-06-27: qty 1

## 2016-06-27 MED ORDER — BIOTIN 10 MG PO CAPS: 1.0000 | ORAL_CAPSULE | Freq: Two times a day (BID) | ORAL | Status: DC

## 2016-06-27 MED ORDER — IBUPROFEN 600 MG PO TABS
600.0000 mg | ORAL_TABLET | Freq: Once | ORAL | Status: AC
  Administered 2016-06-27: 600 mg via ORAL
  Filled 2016-06-27: qty 1

## 2016-06-27 MED ORDER — FENTANYL CITRATE (PF) 100 MCG/2ML IJ SOLN
INTRAMUSCULAR | Status: AC
  Filled 2016-06-27: qty 2

## 2016-06-27 MED ORDER — EPHEDRINE SULFATE 50 MG/ML IJ SOLN
INTRAMUSCULAR | Status: DC | PRN
  Administered 2016-06-27 (×2): 5 mg via INTRAVENOUS

## 2016-06-27 MED ORDER — HEPARIN SODIUM (PORCINE) 1000 UNIT/ML IJ SOLN
INTRAMUSCULAR | Status: DC | PRN
  Administered 2016-06-27: 1000 [IU] via INTRAVENOUS

## 2016-06-27 MED ORDER — PROTAMINE SULFATE 10 MG/ML IV SOLN
INTRAVENOUS | Status: DC | PRN
  Administered 2016-06-27: 40 mg via INTRAVENOUS

## 2016-06-27 MED ORDER — MIDAZOLAM HCL 5 MG/5ML IJ SOLN
INTRAMUSCULAR | Status: DC | PRN
  Administered 2016-06-27: 2 mg via INTRAVENOUS

## 2016-06-27 MED ORDER — ONDANSETRON HCL 4 MG/2ML IJ SOLN
INTRAMUSCULAR | Status: DC | PRN
  Administered 2016-06-27: 4 mg via INTRAVENOUS

## 2016-06-27 MED ORDER — PHENYLEPHRINE HCL 10 MG/ML IJ SOLN
INTRAMUSCULAR | Status: DC | PRN
  Administered 2016-06-27: 120 ug via INTRAVENOUS
  Administered 2016-06-27 (×2): 80 ug via INTRAVENOUS

## 2016-06-27 MED ORDER — SODIUM CHLORIDE 0.9% FLUSH
3.0000 mL | Freq: Two times a day (BID) | INTRAVENOUS | Status: DC
  Administered 2016-06-27: 3 mL via INTRAVENOUS

## 2016-06-27 MED ORDER — ROCURONIUM BROMIDE 100 MG/10ML IV SOLN
INTRAVENOUS | Status: DC | PRN
  Administered 2016-06-27: 50 mg via INTRAVENOUS

## 2016-06-27 MED ORDER — SODIUM CHLORIDE 0.9 % IV SOLN: 250.0000 mL | INTRAVENOUS | Status: DC | PRN

## 2016-06-27 MED ORDER — LIDOCAINE HCL (CARDIAC) 20 MG/ML IV SOLN
INTRAVENOUS | Status: DC | PRN
  Administered 2016-06-27: 100 mg via INTRAVENOUS

## 2016-06-27 MED ORDER — SUGAMMADEX SODIUM 200 MG/2ML IV SOLN
INTRAVENOUS | Status: DC | PRN
  Administered 2016-06-27: 100 mg via INTRAVENOUS

## 2016-06-27 MED ORDER — HEPARIN (PORCINE) IN NACL 2-0.9 UNIT/ML-% IJ SOLN
INTRAMUSCULAR | Status: AC
Start: 2016-06-27 — End: 2016-06-27
  Filled 2016-06-27: qty 500

## 2016-06-27 MED ORDER — SODIUM CHLORIDE 0.9% FLUSH: 3.0000 mL | INTRAVENOUS | Status: DC | PRN

## 2016-06-27 MED ORDER — DEXTROSE 5 % IV SOLN
INTRAVENOUS | Status: DC | PRN
  Administered 2016-06-27: 20 ug/min via INTRAVENOUS

## 2016-06-27 MED ORDER — HEPARIN (PORCINE) IN NACL 2-0.9 UNIT/ML-% IJ SOLN
INTRAMUSCULAR | Status: DC | PRN
  Administered 2016-06-27 (×4): 500 mL

## 2016-06-27 SURGICAL SUPPLY — 24 items
BAG SNAP BAND KOVER 36X36 (MISCELLANEOUS) ×3 IMPLANT
BLANKET WARM UNDERBOD FULL ACC (MISCELLANEOUS) ×3 IMPLANT
CATH SMTCH THERMOCOOL SF DF (CATHETERS) ×2 IMPLANT
CATH SOUNDSTAR 3D IMAGING (CATHETERS) ×2 IMPLANT
CATH VARIABLE LASSO NAV 2515 (CATHETERS) ×2 IMPLANT
CATH WEBSTER BI DIR CS D-F CRV (CATHETERS) ×2 IMPLANT
COVER SWIFTLINK CONNECTOR (BAG) ×3 IMPLANT
NDL TRANSSEPTAL BRK 98CM (NEEDLE) IMPLANT
NDL TRANSSEPTAL BRK XS 98CM (SHEATH) IMPLANT
NEEDLE TRANSSEPTAL BRK 98CM (NEEDLE) ×3 IMPLANT
NEEDLE TRANSSEPTAL BRK XS 98CM (SHEATH) ×3 IMPLANT
PACK EP LATEX FREE (CUSTOM PROCEDURE TRAY) ×3
PACK EP LF (CUSTOM PROCEDURE TRAY) ×1 IMPLANT
PAD DEFIB LIFELINK (PAD) ×3 IMPLANT
PATCH CARTO3 (PAD) ×2 IMPLANT
SHEATH AGILIS NXT 8.5F 71CM (SHEATH) ×4 IMPLANT
SHEATH AVANTI 11F 11CM (SHEATH) ×2 IMPLANT
SHEATH PINNACLE 7F 10CM (SHEATH) ×2 IMPLANT
SHEATH PINNACLE 8F 10CM (SHEATH) ×4 IMPLANT
SHEATH PINNACLE 9F 10CM (SHEATH) ×4 IMPLANT
SHIELD RADPAD SCOOP 12X17 (MISCELLANEOUS) ×3 IMPLANT
TUBING ART PRESS 72  MALE/FEM (TUBING) ×4
TUBING ART PRESS 72 MALE/FEM (TUBING) IMPLANT
TUBING SMART ABLATE COOLFLOW (TUBING) ×2 IMPLANT

## 2016-06-27 NOTE — Progress Notes (Addendum)
Site area: rt groin 2 fv sheaths, lt groin 2 venous sheaths  Site Prior to Removal:  Level 0/ Level 0 Pressure Applied For: 20 minutes / 20 minutes Manual:   yes Patient Status During Pull:  stable Post Pull Site:  Level 0 Post Pull Instructions Given:  yes Post Pull Pulses Present: yes Dressing Applied:  Gauze/small tegaderm Bedrest begins @ 1305 Comments:

## 2016-06-27 NOTE — Anesthesia Procedure Notes (Signed)
Procedure Name: Intubation Date/Time: 06/27/2016 8:57 AM Performed by: Adonis HousekeeperNGELL, Jaleal Schliep M Pre-anesthesia Checklist: Patient identified, Emergency Drugs available, Suction available and Patient being monitored Patient Re-evaluated:Patient Re-evaluated prior to inductionOxygen Delivery Method: Circle system utilized Preoxygenation: Pre-oxygenation with 100% oxygen Intubation Type: IV induction Ventilation: Mask ventilation without difficulty Laryngoscope Size: Mac and 3 Grade View: Grade II Tube type: Oral Tube size: 7.0 mm Number of attempts: 1 Airway Equipment and Method: Stylet Placement Confirmation: positive ETCO2 and breath sounds checked- equal and bilateral Secured at: 22 cm Tube secured with: Tape Dental Injury: Teeth and Oropharynx as per pre-operative assessment

## 2016-06-27 NOTE — Anesthesia Preprocedure Evaluation (Signed)
Anesthesia Evaluation  Patient identified by MRN, date of birth, ID band Patient awake    Reviewed: Allergy & Precautions, NPO status , Patient's Chart, lab work & pertinent test results  Airway Mallampati: II  TM Distance: >3 FB Neck ROM: Full    Dental  (+) Teeth Intact, Dental Advisory Given   Pulmonary former smoker,    breath sounds clear to auscultation       Cardiovascular + dysrhythmias Atrial Fibrillation  Rhythm:Irregular Rate:Normal  Impressions:  - LVEF 55-60%, normal wall thickness, normal wall motion, mild MR,   moderate LAE, moderate TR, RVSP 37 mmHg, normal IVC.   Neuro/Psych negative neurological ROS  negative psych ROS   GI/Hepatic negative GI ROS, Neg liver ROS,   Endo/Other  negative endocrine ROS  Renal/GU negative Renal ROS     Musculoskeletal   Abdominal   Peds  Hematology   Anesthesia Other Findings   Reproductive/Obstetrics                             Anesthesia Physical  Anesthesia Plan  ASA: III  Anesthesia Plan: MAC   Post-op Pain Management:    Induction: Intravenous  Airway Management Planned: Natural Airway and Nasal Cannula  Additional Equipment: None  Intra-op Plan:   Post-operative Plan:   Informed Consent: I have reviewed the patients History and Physical, chart, labs and discussed the procedure including the risks, benefits and alternatives for the proposed anesthesia with the patient or authorized representative who has indicated his/her understanding and acceptance.   Dental advisory given  Plan Discussed with: CRNA and Anesthesiologist  Anesthesia Plan Comments:         Anesthesia Quick Evaluation

## 2016-06-27 NOTE — Progress Notes (Signed)
Dr. Elberta Fortisamnitz made aware of BP 89/50, continues to c/o left upper arm-deltoid area pain.

## 2016-06-27 NOTE — Transfer of Care (Signed)
Immediate Anesthesia Transfer of Care Note  Patient: Kathy GovernNancy Patel  Procedure(s) Performed: Procedure(s): Atrial Fibrillation Ablation (N/A)  Patient Location: PACU and Cath Lab  Anesthesia Type:General  Level of Consciousness: awake, alert , oriented and patient cooperative  Airway & Oxygen Therapy: Patient Spontanous Breathing and Patient connected to nasal cannula oxygen  Post-op Assessment: Report given to RN and Post -op Vital signs reviewed and stable  Post vital signs: Reviewed and stable spo2 100% bp 97/61  Last Vitals:  Vitals:   06/27/16 0649 06/27/16 1154  BP: 103/67   Pulse: 64   Resp: 20   Temp: 36.6 C 36.5 C    Last Pain:  Vitals:   06/27/16 0649  TempSrc: Oral         Complications: No apparent anesthesia complications

## 2016-06-27 NOTE — H&P (Signed)
Kathy Patel is a 65 y.o. female with a history of atrial fibrillation.  She had ablation 06/2016. She had a second episode of atrial fibrillation rates in the 160s and BP in the 70s per her and her husband.  She presents for repeat ablation of atrial fibrillation.  On exam, regular rhythm, no murmurs, lungs clear. Risks and benefits discussed.  Risks include but not limited to bleeding, tamponade, heart block, stroke, and damage to surrounding organs.  She understands these risks and has agreed to the procedure.  Will Elberta Fortisamnitz, MD 06/27/2016 7:11 AM

## 2016-06-27 NOTE — Anesthesia Postprocedure Evaluation (Signed)
Anesthesia Post Note  Patient: Kathy GovernNancy Deroos  Procedure(s) Performed: Procedure(s) (LRB): Atrial Fibrillation Ablation (N/A)  Patient location during evaluation: PACU Anesthesia Type: General Level of consciousness: sedated Pain management: pain level controlled Vital Signs Assessment: post-procedure vital signs reviewed and stable Respiratory status: spontaneous breathing and respiratory function stable Cardiovascular status: stable Anesthetic complications: no    Last Vitals:  Vitals:   06/27/16 1210 06/27/16 1215  BP: 96/64 95/60  Pulse: 67 63  Resp: 17 13  Temp:      Last Pain:  Vitals:   06/27/16 0649  TempSrc: Oral                 Etna Forquer DANIEL

## 2016-06-27 NOTE — Progress Notes (Signed)
Dr. Elberta Fortisamnitz in to see patient.

## 2016-06-27 NOTE — Progress Notes (Signed)
PHARMACIST - PHYSICIAN ORDER COMMUNICATION  CONCERNING: P&T Medication Policy on Herbal Medications  DESCRIPTION:  This patient's order for:  Biotin  has been noted.  This product(s) is classified as an "herbal" or natural product. Due to a lack of definitive safety studies or FDA approval, nonstandard manufacturing practices, plus the potential risk of unknown drug-drug interactions while on inpatient medications, the Pharmacy and Therapeutics Committee does not permit the use of "herbal" or natural products of this type within [facility].   ACTION TAKEN: The pharmacy department is unable to verify this order at this time and your patient has been informed of this safety policy. Please reevaluate patient's clinical condition at discharge and address if the herbal or natural product(s) should be resumed at that time.  Tad MooreJessica Sadik Piascik, Pharm D, BCPS  Clinical Pharmacist Pager [phone]  06/27/2016 5:37 PM

## 2016-06-28 ENCOUNTER — Encounter (HOSPITAL_COMMUNITY): Payer: Self-pay | Admitting: Cardiology

## 2016-06-28 DIAGNOSIS — I480 Paroxysmal atrial fibrillation: Secondary | ICD-10-CM

## 2016-06-28 MED FILL — Adenosine IV Soln 6 MG/2ML: INTRAVENOUS | Qty: 2 | Status: AC

## 2016-06-28 NOTE — Discharge Summary (Signed)
ELECTROPHYSIOLOGY PROCEDURE DISCHARGE SUMMARY    Patient ID: Kathy Patel,  MRN: [id number], DOB/AGE: [birth year] 65 y.o.  Admit date: 06/27/2016 Discharge date: 06/28/2016  Primary Care Physician: Allean FoundSMITH,CANDACE THIELE, MD [facility]   Primary Discharge Diagnosis:  Paroxysmal atrial fibrillation status post ablation this admission  Secondary Discharge Diagnosis:  1.  Hyperlipidemia  Procedures This Admission:  1.  Electrophysiology study and radiofrequency catheter ablation on 06/27/16 by Dr Elberta Fortisamnitz.  This study demonstrated sinus rhythm upon presentation; successful electrical isolation and anatomical encircling of the left sided pulmonary veins with radiofrequency current; no inducible arrhythmias following ablation both on and off of Isuprel; no early apparent complications.    Brief HPI: Kathy Patel is a 65 y.o. female with a history of paroxysmal atrial fibrillation.  She underwent ablation in December of 2016 and did well initially but then had recurrent atrial fibrillation. Risks, benefits to repeat PVI were reviewed with the patient who wished to proceed.  The patient underwent cardiac CT prior to the procedure which demonstrated no LAA thrombus.    Hospital Course:  The patient was admitted and underwent EPS/RFCA of atrial fibrillation with details as outlined above.  They were monitored on telemetry overnight which demonstrated sinus rhythm.  Groin was without complication on the day of discharge.  The patient was examined and considered to be stable for discharge.  Wound care and restrictions were reviewed with the patient.  The patient Larence Thone be seen back by Rudi Cocoonna Carroll, NP in 4 weeks and Dr Elberta Fortisamnitz in 12 weeks for post ablation follow up.   This patients CHA2DS2-VASc Score and unadjusted Ischemic Stroke Rate (% per year) is equal to 0.6 % stroke rate/year from a score of 1 Above score calculated as 1 point each if present [CHF, HTN, DM,  Vascular=MI/PAD/Aortic Plaque, Age if 65-74, or Female] Above score calculated as 2 points each if present [Age > 75, or Stroke/TIA/TE]   Physical Exam: Vitals:   06/28/16 0320 06/28/16 0558 06/28/16 0620 06/28/16 0824  BP: (!) 93/58 101/68    Pulse:   67 65  Resp:      Temp:   98.5 F (36.9 C) 98.2 F (36.8 C)  TempSrc:   Axillary Axillary  SpO2:   97%   Weight:   121 lb 8 oz (55.1 kg)   Height:        GEN- The patient is well appearing, alert and oriented x 3 today.   HEENT: normocephalic, atraumatic; sclera clear, conjunctiva pink; hearing intact; oropharynx clear; neck supple  Lungs- Clear to ausculation bilaterally, normal work of breathing.  No wheezes, rales, rhonchi Heart- Regular rate and rhythm, no murmurs, rubs or gallops  GI- soft, non-tender, non-distended, bowel sounds present  Extremities- no clubbing, cyanosis, or edema; DP/PT/radial pulses 2+ bilaterally, groin without hematoma/bruit MS- no significant deformity or atrophy Skin- warm and dry, no rash or lesion Psych- euthymic mood, full affect Neuro- strength and sensation are intact   Labs:   Lab Results  Component Value Date   WBC 3.4 (L) 06/20/2016   HGB 12.2 06/20/2016   HCT 37.0 06/20/2016   MCV 92.0 06/20/2016   PLT 280 06/20/2016   No results for input(s): NA, K, CL, CO2, BUN, CREATININE, CALCIUM, PROT, BILITOT, ALKPHOS, ALT, AST, GLUCOSE in the last 168 hours.  Invalid input(s): LABALBU   Discharge Medications:    Medication List    TAKE these medications   apixaban 5 MG Tabs tablet Commonly known as:  ELIQUIS Take 1  tablet (5 mg total) by mouth 2 (two) times daily.   atorvastatin 10 MG tablet Commonly known as:  LIPITOR Take 10 mg by mouth daily.   Biotin 10 MG Caps Take 1 capsule by mouth 2 (two) times daily.   CALCIUM-VITAMIN D PO Take 1 capsule by mouth daily. Aneyah Lortz stop prior to procedure   TURMERIC PO Take 1 tablet by mouth 2 (two) times daily.       Disposition:    Discharge Instructions    Diet - low sodium heart healthy    Complete by:  As directed    Increase activity slowly    Complete by:  As directed      Follow-up Information    [facility] Follow up on 07/27/2016.   Specialty:  Cardiology Why:  at 11:30AM  Contact information: [address] [id number] [place] [address] [phone]       Demarcus Thielke Jorja LoaMartin Kash Mothershead, MD Follow up on 09/28/2016.   Specialty:  Cardiology Why:  at 10:45AM  Contact information: [address] [place] [address] [phone]           Duration of Discharge Encounter: Greater than 30 minutes including physician time.  Signed, Gypsy BalsamAmber Seiler, NP 06/28/2016 8:50 AM  I have seen and examined this patient with Gypsy BalsamAmber Seiler.  Agree with above, note added to reflect my findings.  On exam, regular rhythm, no murmurs, lungs clear. Had repeat AF ablation with touch up of the LIPV and LSPV. Tolerated procedure well. Plan for discharge today with follow up in AF clinic.    Mithra Spano M. Adis Sturgill MD 06/28/2016 9:04 AM

## 2016-06-28 NOTE — Discharge Instructions (Signed)
You have an appointment set up with the Atrial Fibrillation Clinic.  Multiple studies have shown that being followed by a dedicated atrial fibrillation clinic in addition to the standard care you receive from your other physicians improves health. We believe that enrollment in the atrial fibrillation clinic will allow us to better care for you.  ° °The phone number to the Atrial Fibrillation Clinic is [phone]. The clinic is staffed Monday through Friday from 8:30am to 5pm. ° °Parking Directions: The clinic is located in the Heart and Vascular Building connected to [facility]. °1)From [address] turn on to [address] and go to the 3rd entrance  (Heart and Vascular entrance) on the right. °2)Look to the right for Heart &Vascular Parking Garage. °3)A code for the entrance is required please call the clinic to receive this.   °4)Take the elevators to the 1st floor. Registration is in the room with the glass walls at the end of the hallway. ° °If you have any trouble parking or locating the clinic, please don’t hesitate to call [phone]. ° °No driving for 4 days. No lifting over 5 lbs for 1 week. No sexual activity for 1 week. You may return to work in 1 week. Keep procedure site clean & dry. If you notice increased pain, swelling, bleeding or pus, call/return!  You may shower, but no soaking baths/hot tubs/pools for 1 week.  ° °

## 2016-07-27 ENCOUNTER — Ambulatory Visit (HOSPITAL_COMMUNITY)
Admission: RE | Admit: 2016-07-27 | Discharge: 2016-07-27 | Disposition: A | Discharge status: Home / Self Care | Payer: No Typology Code available for payment source | Source: Ambulatory Visit | Attending: Nurse Practitioner | Admitting: Nurse Practitioner

## 2016-07-27 ENCOUNTER — Encounter (HOSPITAL_COMMUNITY): Payer: Self-pay | Admitting: Nurse Practitioner

## 2016-07-27 VITALS — BP 102/64 | HR 69 | Ht 63.0 in | Wt 122.2 lb

## 2016-07-27 DIAGNOSIS — Z87891 Personal history of nicotine dependence: Secondary | ICD-10-CM | POA: Diagnosis not present

## 2016-07-27 DIAGNOSIS — Z9889 Other specified postprocedural states: Secondary | ICD-10-CM | POA: Diagnosis not present

## 2016-07-27 DIAGNOSIS — I480 Paroxysmal atrial fibrillation: Secondary | ICD-10-CM | POA: Insufficient documentation

## 2016-07-27 DIAGNOSIS — E785 Hyperlipidemia, unspecified: Secondary | ICD-10-CM | POA: Diagnosis not present

## 2016-07-27 DIAGNOSIS — I4891 Unspecified atrial fibrillation: Secondary | ICD-10-CM | POA: Diagnosis present

## 2016-07-27 DIAGNOSIS — I517 Cardiomegaly: Secondary | ICD-10-CM | POA: Insufficient documentation

## 2016-07-27 NOTE — Progress Notes (Signed)
Patient ID: Orville GovernNancy Patel, female   DOB: 07-21-1951, 65 y.o.   MRN: [id number]     Primary Care Physician: Allean FoundSMITH,CANDACE THIELE, MD Referring Physician: Dr. Glennie Isleamnitz   Kathy Patel is a 65 y.o. female with a h/o afib s/p ablation 07/15/15 and 11/29 for f/u of latest ablation. She has not noticed any further afib. No swallowing issues or groin issues. Continues apixaban.  Today, she denies symptoms of palpitations, chest pain, shortness of breath, orthopnea, PND, lower extremity edema, dizziness, presyncope, syncope, or neurologic sequela. The patient is tolerating medications without difficulties and is otherwise without complaint today.   Past Medical History:  Diagnosis Date  . Hyperlipidemia   . PAF (paroxysmal atrial fibrillation) (HCC)    s/p ablation 07/15/2015; anticoagulation w/ Eliquis x 1 month   Past Surgical History:  Procedure Laterality Date  . ATRIAL FIBRILLATION ABLATION  07/15/2015  . ELECTROPHYSIOLOGIC STUDY  07/15/2015      . ELECTROPHYSIOLOGIC STUDY N/A 07/15/2015   Procedure: Atrial Fibrillation Ablation;  Surgeon: Will Jorja LoaMartin Camnitz, MD;  [facility];  Service: Cardiovascular;  Laterality: N/A;  . ELECTROPHYSIOLOGIC STUDY N/A 06/27/2016   Procedure: Atrial Fibrillation Ablation;  Surgeon: Will Jorja LoaMartin Camnitz, MD;  Location: [facility];  Service: Cardiovascular;  Laterality: N/A;    Current Outpatient Prescriptions  Medication Sig Dispense Refill  . apixaban (ELIQUIS) 5 MG TABS tablet Take 1 tablet (5 mg total) by mouth 2 (two) times daily. 180 tablet 2  . atorvastatin (LIPITOR) 10 MG tablet Take 10 mg by mouth daily.    . Biotin 10 MG CAPS Take 1 capsule by mouth 2 (two) times daily.    Marland Kitchen. CALCIUM-VITAMIN D PO Take 1 capsule by mouth daily. Will stop prior to procedure    . TURMERIC PO Take 1 tablet by mouth 2 (two) times daily.      No current facility-administered medications for this encounter.     No Known Allergies  Social  History   Social History  . Marital status: Married    Spouse name: N/A  . Number of children: N/A  . Years of education: N/A   Occupational History  . Not on file.   Social History Main Topics  . Smoking status: Former Games developermoker  . Smokeless tobacco: Not on file  . Alcohol use 0.0 oz/week  . Drug use: No  . Sexual activity: Not on file   Other Topics Concern  . Not on file   Social History Narrative  . No narrative on file    Family History  Problem Relation Age of Onset  . Alzheimer's disease Mother 8960  . Osteoporosis Mother   . Diabetes Father   . Osteopenia Sister   . Hyperlipidemia Brother   . Heart failure Maternal Grandmother     ROS- All systems are reviewed and negative except as per the HPI above  Physical Exam: Vitals:   07/27/16 1125  BP: 102/64  Pulse: 69  Weight: 122 lb 3.2 oz (55.4 kg)  Height: 5\' 3"  (1.6 m)    GEN- The patient is well appearing, alert and oriented x 3 today.   Head- normocephalic, atraumatic Eyes-  Sclera clear, conjunctiva pink Ears- hearing intact Oropharynx- clear Neck- supple, no JVP Lymph- no cervical lymphadenopathy Lungs- Clear to ausculation bilaterally, normal work of breathing Heart- Regular rate and rhythm, no murmurs, rubs or gallops, PMI not laterally displaced GI- soft, NT, ND, + BS Extremities- no clubbing, cyanosis, or edema MS- no significant deformity  or atrophy Skin- no rash or lesion Psych- euthymic mood, full affect Neuro- strength and sensation are intact  EKG- NSR, 69 bpm, pr int 136 ms, qrs int 82 ms, qtc 447 ms Epic records reviewed  Assessment and Plan: 1. afib S/p ablation x2 and maintaining SR Continue apixaban    F/u with Dr. Elberta Fortisamnitz  09/28/16 afib clinic as needed   Lupita LeashDonna C. Matthew Folksarroll, ANP-C [facility] [address] [place], [place] [address] [phone]

## 2016-09-19 ENCOUNTER — Encounter: Payer: Self-pay | Admitting: Cardiology

## 2016-09-20 ENCOUNTER — Telehealth: Payer: Self-pay | Admitting: Cardiology

## 2016-09-20 NOTE — Telephone Encounter (Signed)
Follow Up   Pt returning phone call about blood thinner

## 2016-09-20 NOTE — Telephone Encounter (Signed)
lmtcb   (ok to take)

## 2016-09-20 NOTE — Telephone Encounter (Signed)
Advised ok to take w/ Eliquis.  Informed Coldeze will not affect Eliquis's blood thinning properties. Patient verbalized understanding and agreeable to plan.

## 2016-09-20 NOTE — Telephone Encounter (Signed)
New Message    Pt has a cold and she bought Coldeze, on the box it says to call your doctor before taking it because it blocks blood thinners

## 2016-09-28 ENCOUNTER — Telehealth: Payer: Self-pay | Admitting: Cardiology

## 2016-09-28 ENCOUNTER — Encounter: Payer: Self-pay | Admitting: Cardiology

## 2016-09-28 ENCOUNTER — Ambulatory Visit (INDEPENDENT_AMBULATORY_CARE_PROVIDER_SITE_OTHER): Payer: No Typology Code available for payment source | Admitting: Cardiology

## 2016-09-28 VITALS — BP 90/68 | HR 64 | Ht 63.0 in | Wt 121.2 lb

## 2016-09-28 DIAGNOSIS — I480 Paroxysmal atrial fibrillation: Secondary | ICD-10-CM

## 2016-09-28 MED ORDER — METOPROLOL SUCCINATE ER 25 MG PO TB24: 25.0000 mg | ORAL_TABLET | Freq: Every day | ORAL | 0 refills | Status: DC | PRN

## 2016-09-28 MED ORDER — FLECAINIDE ACETATE 150 MG PO TABS: 300.0000 mg | ORAL_TABLET | Freq: Every day | ORAL | 0 refills | Status: DC | PRN

## 2016-09-28 MED ORDER — FLECAINIDE ACETATE 100 MG PO TABS: 100.0000 mg | ORAL_TABLET | Freq: Every day | ORAL | 0 refills | Status: DC | PRN

## 2016-09-28 NOTE — Telephone Encounter (Signed)
Wille CelesteJanie, Pharmacist at [facility] has a question about the directions for the Toprol medication. Please call, thanks.

## 2016-09-28 NOTE — Progress Notes (Signed)
Electrophysiology Office Note   Date:  09/28/2016   ID:  [name], DOB Jul 15, 1951, MRN [id number]  PCP:  Allean Found, MD  Primary Electrophysiologist:  Regan Lemming, MD    Chief Complaint  Patient presents with  . Follow-up    PAF     History of Present Illness: Kathy Patel is a 66 y.o. female who presents today for electrophysiology evaluation.   She had AF noted when she presented for colonoscopy.  She had multiple episodes of palpitations after that time, and underwent AF ablation on 07/15/15.  She had felt well up until last night when she had a proximally to a half hours of atrial fibrillation. She says that she did nothing new yesterday. She has continued to be active and has not started any new medications. She is had repeat AF ablation 03/27/16.  Today, she denies symptoms of palpitations, chest pain, shortness of breath, orthopnea, PND, lower extremity edema, claudication, dizziness, presyncope, syncope, bleeding, or neurologic sequela. The patient is tolerating medications without difficulties and is otherwise without complaint today.    Past Medical History:  Diagnosis Date  . Hyperlipidemia   . PAF (paroxysmal atrial fibrillation) (HCC)    s/p ablation 07/15/2015; anticoagulation w/ Eliquis x 1 month   Past Surgical History:  Procedure Laterality Date  . ATRIAL FIBRILLATION ABLATION  07/15/2015  . ELECTROPHYSIOLOGIC STUDY  07/15/2015      . ELECTROPHYSIOLOGIC STUDY N/A 07/15/2015   Procedure: Atrial Fibrillation Ablation;  Surgeon: Tessla Spurling Jorja Loa, MD;  [facility];  Service: Cardiovascular;  Laterality: N/A;  . ELECTROPHYSIOLOGIC STUDY N/A 06/27/2016   Procedure: Atrial Fibrillation Ablation;  Surgeon: Adysson Revelle Jorja Loa, MD;  [facility];  Service: Cardiovascular;  Laterality: N/A;     Current Outpatient Prescriptions  Medication Sig Dispense Refill  . apixaban (ELIQUIS) 5 MG TABS tablet Take 1  tablet (5 mg total) by mouth 2 (two) times daily. 180 tablet 2  . atorvastatin (LIPITOR) 10 MG tablet Take 10 mg by mouth daily.    . Biotin 10 MG CAPS Take 1 capsule by mouth 2 (two) times daily.    Marland Kitchen CALCIUM-VITAMIN D PO Take 1 capsule by mouth daily. Kathy Patel stop prior to procedure    . HYDROcodone-homatropine (HYCODAN) 5-1.5 MG/5ML syrup Take 5 mLs by mouth every 6 (six) hours as needed for cough.    . TURMERIC PO Take 1 tablet by mouth 2 (two) times daily.     . flecainide (TAMBOCOR) 100 MG tablet Take 1 tablet (100 mg total) by mouth daily as needed. with Toprol 25 mg tablet for AFib episode 10 tablet 0  . metoprolol succinate (TOPROL XL) 25 MG 24 hr tablet Take 1 tablet (25 mg total) by mouth daily as needed. With Flecainide 300 mg tablet for AFib episodes 10 tablet 0   No current facility-administered medications for this visit.     Allergies:   Patient has no known allergies.   Social History:  The patient  reports that she has quit smoking. She has never used smokeless tobacco. She reports that she drinks alcohol. She reports that she does not use drugs.   Family History:  The patient's family history includes Alzheimer's disease (age of onset: 79) in her mother; Diabetes in her father; Heart failure in her maternal grandmother; Hyperlipidemia in her brother; Osteopenia in her sister; Osteoporosis in her mother.    ROS:  Please see the history of present illness.   Otherwise, review  of systems is positive for none.   All other systems are reviewed and negative.    PHYSICAL EXAM: VS:  BP 90/68   Pulse 64   Ht 5\' 3"  (1.6 m)   Wt 121 lb 3.2 oz (55 kg)   BMI 21.47 kg/m  , BMI Body mass index is 21.47 kg/m. GEN: Well nourished, well developed, in no acute distress  HEENT: normal  Neck: no JVD, carotid bruits, or masses Cardiac: RRR; no murmurs, rubs, or gallops,no edema  Respiratory:  clear to auscultation bilaterally, normal work of breathing GI: soft, nontender, nondistended, +  BS MS: no deformity or atrophy  Skin: warm and dry Neuro:  Strength and sensation are intact Psych: euthymic mood, full affect  EKG:  EKG is ordered today. Personal review of the ECG ordered  shows sinus rhythm, rate 64  Recent Labs: 06/20/2016: BUN 17; Creat 0.77; Hemoglobin 12.2; Platelets 280; Potassium 4.0; Sodium 138    Lipid Panel  No results found for: CHOL, TRIG, HDL, CHOLHDL, VLDL, LDLCALC, LDLDIRECT   Wt Readings from Last 3 Encounters:  09/28/16 121 lb 3.2 oz (55 kg)  07/27/16 122 lb 3.2 oz (55.4 kg)  06/28/16 121 lb 8 oz (55.1 kg)      Other studies Reviewed: Additional studies/ records that were reviewed today include: TTE  Review of the above records today demonstrates:  - LVEF 55-60%, normal wall thickness, normal wall motion, mild MR, moderate LAE, moderate TR, RVSP 37 mmHg, normal IVC.    ASSESSMENT AND PLAN:  1.  Atrial fibrillation:  Had repeat ablation for 06/27/16. Is on any further episodes of palpitations. That being said, she is extremely anxious about going back into atrial fibrillation. She is going [place] a trip to [place]iking in the future. We'll plan to stop her anticoagulation as she is going to be biking. Her stroke risk is low enough so that this is not an issue. We Kathy Patel also give her a pill in the pocket flecainide with metoprolol as well in case she does go back out of rhythm.  This patients CHA2DS2-VASc Score and unadjusted Ischemic Stroke Rate (% per year) is equal to 0.6 % stroke rate/year from a score of 1  Above score calculated as 1 point each if present [CHF, HTN, DM, Vascular=MI/PAD/Aortic Plaque, Age if 65-74, or Female] Above score calculated as 2 points each if present [Age > 75, or Stroke/TIA/TE]  Current medicines are reviewed at length with the patient today.   The patient does not have concerns regarding her medicines.  The following changes were made today:  PRN metoprolol and flecainide  Labs/ tests ordered today  include:  Orders Placed This Encounter  Procedures  . EKG 12-Lead     Disposition:   FU with Kathy Patel 3 months  Signed, Dushaun Okey Jorja LoaMartin Kingstyn Deruiter, MD  09/28/2016 11:23 AM     [facility] [address] [place] [address] [phone] (office) [phone] (fax)

## 2016-09-28 NOTE — Patient Instructions (Addendum)
Medication Instructions:   Your physician has recommended you make the following change in your medication:     PILL IN THE POCKET: 1) You may take the following medications together for AFib episode.  You may take  these together once daily as needed:  Flecainide 300 mg + Toprol 25 mg    --- If you need a refill on your cardiac medications before your next appointment, please call your pharmacy. ---  Labwork:  None ordered  Testing/Procedures:  None ordered  Follow-Up:  Your physician recommends that you schedule a follow-up appointment in: July with Dr. Elberta Fortisamnitz.  Thank you for choosing [facility]!!   Dory HornSherri Briyah Wheelwright, RN [phone]

## 2016-09-28 NOTE — Telephone Encounter (Signed)
Flecainide sent in as 100 mg tablet, not 300 mg.  Corrected rx to Flecainide 300 mg BID  - take 2, 150 mg tablets twice daily along w/ Toprol 25 mg tablet. Pt aware.

## 2016-12-26 ENCOUNTER — Encounter: Payer: Self-pay | Admitting: Cardiology

## 2017-01-16 ENCOUNTER — Ambulatory Visit (INDEPENDENT_AMBULATORY_CARE_PROVIDER_SITE_OTHER): Payer: No Typology Code available for payment source | Admitting: Cardiology

## 2017-01-16 ENCOUNTER — Encounter: Payer: Self-pay | Admitting: Cardiology

## 2017-01-16 VITALS — BP 110/62 | HR 68 | Ht 63.0 in | Wt 121.4 lb

## 2017-01-16 DIAGNOSIS — I480 Paroxysmal atrial fibrillation: Secondary | ICD-10-CM

## 2017-01-16 NOTE — Patient Instructions (Signed)
Medication Instructions:  Your physician recommends that you continue on your current medications as directed. Please refer to the Current Medication list given to you today.  If you need a refill on your cardiac medications before your next appointment, please call your pharmacy.   Labwork: None ordered  Testing/Procedures: None ordered  Follow-Up: Your physician wants you to follow-up in: 6 months with Dr. Camnitz.  You will receive a reminder letter in the mail two months in advance. If you don't receive a letter, please call our office to schedule the follow-up appointment.  Thank you for choosing [facility]!!   Deon Duer, RN [phone]         

## 2017-01-16 NOTE — Progress Notes (Signed)
Electrophysiology Office Note   Date:  01/16/2017   ID:  Kathy Patel, DOB 07/18/1951, MRN [id number]  PCP:  Merri Brunette, MD  Primary Electrophysiologist:  Will Jorja Loa, MD    Chief Complaint  Patient presents with  . Follow-up    PAF     History of Present Illness: Kathy Patel is a 66 y.o. female who presents today for electrophysiology evaluation.   She had AF noted when she presented for colonoscopy.  She had multiple episodes of palpitations after that time, and underwent AF ablation on 07/15/15.  She had repeat AF ablation 03/27/16.  Today, denies symptoms of palpitations, chest pain, shortness of breath, orthopnea, PND, lower extremity edema, claudication, dizziness, presyncope, syncope, bleeding, or neurologic sequela. The patient is tolerating medications without difficulties and is otherwise without complaint today. She has been feeling well. She has not noted any further episodes of atrial fibrillation.   Past Medical History:  Diagnosis Date  . Hyperlipidemia   . PAF (paroxysmal atrial fibrillation) (HCC)    s/p ablation 07/15/2015; anticoagulation w/ Eliquis x 1 month   Past Surgical History:  Procedure Laterality Date  . ATRIAL FIBRILLATION ABLATION  07/15/2015  . ELECTROPHYSIOLOGIC STUDY  07/15/2015      . ELECTROPHYSIOLOGIC STUDY N/A 07/15/2015   Procedure: Atrial Fibrillation Ablation;  Surgeon: Will Jorja Loa, MD;  [facility];  Service: Cardiovascular;  Laterality: N/A;  . ELECTROPHYSIOLOGIC STUDY N/A 06/27/2016   Procedure: Atrial Fibrillation Ablation;  Surgeon: Will Jorja Loa, MD;  [facility];  Service: Cardiovascular;  Laterality: N/A;     Current Outpatient Prescriptions  Medication Sig Dispense Refill  . apixaban (ELIQUIS) 5 MG TABS tablet Take 1 tablet (5 mg total) by mouth 2 (two) times daily. 180 tablet 2  . atorvastatin (LIPITOR) 10 MG tablet Take 10 mg by mouth daily.    . Biotin 10 MG  CAPS Take 1 capsule by mouth 2 (two) times daily.    Marland Kitchen CALCIUM-VITAMIN D PO Take 1 capsule by mouth daily. Will stop prior to procedure    . flecainide (TAMBOCOR) 150 MG tablet Take 2 tablets (300 mg total) by mouth daily as needed. with Toprol 25 mg tablet for AFib episode 20 tablet 0  . metoprolol succinate (TOPROL XL) 25 MG 24 hr tablet Take 1 tablet (25 mg total) by mouth daily as needed. With Flecainide 300 mg tablet for AFib episodes 10 tablet 0  . TURMERIC PO Take 1 tablet by mouth 2 (two) times daily.      No current facility-administered medications for this visit.     Allergies:   Patient has no known allergies.   Social History:  The patient  reports that she has quit smoking. She has never used smokeless tobacco. She reports that she drinks alcohol. She reports that she does not use drugs.   Family History:  The patient's family history includes Alzheimer's disease (age of onset: 18) in her mother; Diabetes in her father; Heart failure in her maternal grandmother; Hyperlipidemia in her brother; Osteopenia in her sister; Osteoporosis in her mother.    ROS:  Please see the history of present illness.   Otherwise, review of systems is positive for cough.   All other systems are reviewed and negative.     PHYSICAL EXAM: VS:  BP 110/62   Pulse 68   Ht 5\' 3"  (1.6 m)   Wt 121 lb 6.4 oz (55.1 kg)   BMI 21.51 kg/m  ,  BMI Body mass index is 21.51 kg/m. GEN: Well nourished, well developed, in no acute distress  HEENT: normal  Neck: no JVD, carotid bruits, or masses Cardiac: RRR; no murmurs, rubs, or gallops,no edema  Respiratory:  clear to auscultation bilaterally, normal work of breathing GI: soft, nontender, nondistended, + BS MS: no deformity or atrophy  Skin: warm and dry Neuro:  Strength and sensation are intact Psych: euthymic mood, full affect  EKG:  EKG is ordered today. Personal review of the ekg ordered shows sinus rhythm, rate 68  Recent Labs: 06/20/2016: BUN  17; Creat 0.77; Hemoglobin 12.2; Platelets 280; Potassium 4.0; Sodium 138    Lipid Panel  No results found for: CHOL, TRIG, HDL, CHOLHDL, VLDL, LDLCALC, LDLDIRECT   Wt Readings from Last 3 Encounters:  01/16/17 121 lb 6.4 oz (55.1 kg)  09/28/16 121 lb 3.2 oz (55 kg)  07/27/16 122 lb 3.2 oz (55.4 kg)      Other studies Reviewed: Additional studies/ records that were reviewed today include: TTE  Review of the above records today demonstrates:  - LVEF 55-60%, normal wall thickness, normal wall motion, mild MR, moderate LAE, moderate TR, RVSP 37 mmHg, normal IVC.    ASSESSMENT AND PLAN:  1.  Atrial fibrillation:  Repeat ablation on 06/27/16. She has done well since that time. She did have a trip to [place] that went well. She recently turned 66. Her stroke risk is elevated now that she is [age], but she prefers not to be anticoagulated unless she has another episode of atrial fibrillation.  This patients CHA2DS2-VASc Score and unadjusted Ischemic Stroke Rate (% per year) is equal to 2.2 % stroke rate/year from a score of 2  Above score calculated as 1 point each if present [CHF, HTN, DM, Vascular=MI/PAD/Aortic Plaque, Age if 65-74, or Female] Above score calculated as 2 points each if present [Age > 75, or Stroke/TIA/TE]  Current medicines are reviewed at length with the patient today.   The patient does not have concerns regarding her medicines.  The following changes were made today:  none  Labs/ tests ordered today include:  Orders Placed This Encounter  Procedures  . EKG 12-Lead     Disposition:   FU with Will Camnitz 6 months  Signed, Will Jorja LoaMartin Camnitz, MD  01/16/2017 11:24 AM     [facility] [address] [place] [address] [phone] (office) [phone] (fax)

## 2017-02-28 ENCOUNTER — Ambulatory Visit (INDEPENDENT_AMBULATORY_CARE_PROVIDER_SITE_OTHER): Payer: No Typology Code available for payment source | Admitting: Pulmonary Disease

## 2017-02-28 ENCOUNTER — Encounter: Payer: Self-pay | Admitting: Pulmonary Disease

## 2017-02-28 ENCOUNTER — Ambulatory Visit (INDEPENDENT_AMBULATORY_CARE_PROVIDER_SITE_OTHER)
Admission: RE | Admit: 2017-02-28 | Discharge: 2017-02-28 | Disposition: A | Discharge status: Home / Self Care | Payer: No Typology Code available for payment source | Source: Ambulatory Visit | Attending: Pulmonary Disease | Admitting: Pulmonary Disease

## 2017-02-28 VITALS — BP 96/58 | HR 59 | Ht 63.0 in | Wt 119.0 lb

## 2017-02-28 DIAGNOSIS — R0600 Dyspnea, unspecified: Secondary | ICD-10-CM

## 2017-02-28 DIAGNOSIS — R05 Cough: Secondary | ICD-10-CM | POA: Diagnosis not present

## 2017-02-28 DIAGNOSIS — K219 Gastro-esophageal reflux disease without esophagitis: Secondary | ICD-10-CM

## 2017-02-28 DIAGNOSIS — R059 Cough, unspecified: Secondary | ICD-10-CM

## 2017-02-28 NOTE — Progress Notes (Signed)
Subjective:    Patient ID: Kathy Patel, female    DOB: [birth year], 66 y.o.   MRN: [id number]  Synopsis: Referred for chronic cough in August 2018. She has a past medical history significant for atrial fibrillation.  HPI Chief Complaint  Patient presents with  . Advice Only    Referred by Dr. Katrinka BlazingSmith for chronic nonprod cough X6 months- [facility].  pt is currently asymptomatic.     [name] had two a-fib ablations and she says that after the second procedure she was noted to have a mucus plug in the right middle lobe.  The two ablations were in 2016 and a second in 2017.  Not long after the second ablation she had a cough.    Cough: > started in January 2018 > she kind of ignored it at first > persisted through June 2018 > sometimes it was so bad she couldn't sleep > refractory to hydrocodone > lying down consistently made it better > sitting up, standing up was better > could feel something running down the back of her throat but she never had a sensation of sinus congestion > zyrtec didn't help > always dry > felt like there was something in her chest but she could never cough up the mucus > had some trouble breathing during this time > saw her PCP during this time and was given cough suppressants several times > multiple physician visits: prednisone, antibiotics (z-pack, cefdinir), cough suppressants > took an an antacid in June > better with prilosec > no repeat CXR in 2018  Sometimes has sinus congestion when the pollen is out.   No childhood respiratory illnesses.  No pneumonia.  She had pneumonia when she was pregnant 27 years ago, she says she was really sick, not hospitalized.  No recurrent respiratory infections.  She smoked some when she was young, quit age 66 after smoking for 3 years, maybe 3 cigarettes a day.  She worked as a Pharmacologistnurse and a teacher.    Dyspnea: > there was concern for exercise induced asthma years ago (2014) > was diagnosed with  exercise induced asthma > better with albuterol prior to exercise > would feel much worse with exercise > albuterol might have helped a little > she thinks she had afib at the time     Past Medical History:  Diagnosis Date  . Hyperlipidemia   . PAF (paroxysmal atrial fibrillation) (HCC)    s/p ablation 07/15/2015; anticoagulation w/ Eliquis x 1 month     Family History  Problem Relation Age of Onset  . Alzheimer's disease Mother 8060  . Osteoporosis Mother   . Diabetes Father   . Osteopenia Sister   . Hyperlipidemia Brother   . Heart failure Maternal Grandmother      Social History   Social History  . Marital status: Married    Spouse name: N/A  . Number of children: N/A  . Years of education: N/A   Occupational History  . Not on file.   Social History Main Topics  . Smoking status: Former Smoker    Types: Cigarettes    Quit date: 02/28/1977  . Smokeless tobacco: Never Used     Comment: social smoker per pt  . Alcohol use 0.0 oz/week  . Drug use: No  . Sexual activity: Not on file   Other Topics Concern  . Not on file   Social History Narrative  . No narrative on file     No Known Allergies  Outpatient Medications Prior to Visit  Medication Sig Dispense Refill  . atorvastatin (LIPITOR) 10 MG tablet Take 10 mg by mouth daily.    . Biotin 10 MG CAPS Take 1 capsule by mouth 2 (two) times daily.    Marland Kitchen CALCIUM-VITAMIN D PO Take 1 capsule by mouth daily. Will stop prior to procedure    . flecainide (TAMBOCOR) 150 MG tablet Take 2 tablets (300 mg total) by mouth daily as needed. with Toprol 25 mg tablet for AFib episode 20 tablet 0  . metoprolol succinate (TOPROL XL) 25 MG 24 hr tablet Take 1 tablet (25 mg total) by mouth daily as needed. With Flecainide 300 mg tablet for AFib episodes 10 tablet 0  . TURMERIC PO Take 1 tablet by mouth 2 (two) times daily.     Marland Kitchen apixaban (ELIQUIS) 5 MG TABS tablet Take 1 tablet (5 mg total) by mouth 2 (two) times daily. (Patient  not taking: Reported on 02/28/2017) 180 tablet 2   No facility-administered medications prior to visit.       Review of Systems  Constitutional: Negative for fever and unexpected weight change.  HENT: Negative for congestion, dental problem, ear pain, nosebleeds, postnasal drip, rhinorrhea, sinus pressure, sneezing, sore throat and trouble swallowing.   Eyes: Negative for redness and itching.  Respiratory: Negative for cough, chest tightness, shortness of breath and wheezing.   Cardiovascular: Negative for palpitations and leg swelling.  Gastrointestinal: Negative for nausea and vomiting.  Genitourinary: Negative for dysuria.  Musculoskeletal: Negative for joint swelling.  Skin: Negative for rash.  Neurological: Negative for headaches.  Hematological: Does not bruise/bleed easily.  Psychiatric/Behavioral: Negative for dysphoric mood. The patient is not nervous/anxious.        Objective:   Physical Exam Vitals:   02/28/17 1046  BP: (!) 96/58  Pulse: (!) 59  SpO2: 98%  Weight: 119 lb (54 kg)  Height: 5\' 3"  (1.6 m)   Gen: well appearing, no acute distress HENT: NCAT, OP clear, neck supple without masses Eyes: PERRL, EOMi Lymph: no cervical lymphadenopathy PULM: CTA B CV: RRR, no mgr, no JVD GI: BS+, soft, nontender, no hsm Derm: no rash or skin breakdown MSK: normal bulk and tone Neuro: A&Ox4, CN II-XII intact, strength 5/5 in all 4 extremities Psyche: normal mood and affect   12/2016 Cardiology records reviewed were she was seen for A. fib and maintained on her current regimen.  Chest imaging: 05/2016 CT chest images reviewed: Small amount of mucus plugging in the right middle lobe otherwise normal airways bilaterally     Assessment & Plan:   Cough - Plan: Nitric oxide, Pulmonary function test, DG Chest 2 View  Dyspnea, unspecified type - Plan: Pulmonary function test  Gastroesophageal reflux disease, esophagitis presence not specified  Discussion: She presents  with cough which lasted for several months and resolved finally after taking Prilosec. She had multiple treatments during this time including prednisone and antibiotics but these it never really been effective until she started taking Prilosec. She has normal lungs on exam today, her chest CT shows essentially normal pulmonary parenchyma. It's possible that she may have asthma given her exertional bronchospasm and shortness of breath over the last few years in the mucus plugging seen on the CT scan of her chest, but this would be mild disease based on her lack of symptoms otherwise.  I explained to her and her husband today that the causes of chronic cough are long, including postnasal drip acid reflux and underlying lung disease.  In her particular case I believe that acid reflux was contributing significantly but postnasal drip was also likely at play as well. It's unclear if there is an underlying lung disease right now but if there is I would be most concerned about asthma. Will get lung function testing and a next held nitric oxide testing a chest x-ray to try to get to the bottom of that.  Plan:  For your cough:  Keep taking Prilosec for now We are going to evaluate for asthma with a lung function test, and exhaled nitric oxide test, and a chest x-ray  For the shortness of breath: Workup as above with lung function testing chest x-ray and nitric oxide testing Keep using albuterol on an as-needed basis for chest tightness wheezing or shortness of breath  We will see you back in 1 to 2 weeks after the pulmonary trunk and test is back, over book OK or nurse practitioner visit    Current Outpatient Prescriptions:  .  aspirin EC 81 MG tablet, Take 81 mg by mouth daily., Disp: , Rfl:  .  atorvastatin (LIPITOR) 10 MG tablet, Take 10 mg by mouth daily., Disp: , Rfl:  .  Biotin 10 MG CAPS, Take 1 capsule by mouth 2 (two) times daily., Disp: , Rfl:  .  CALCIUM-VITAMIN D PO, Take 1 capsule by mouth  daily. Will stop prior to procedure, Disp: , Rfl:  .  flecainide (TAMBOCOR) 150 MG tablet, Take 2 tablets (300 mg total) by mouth daily as needed. with Toprol 25 mg tablet for AFib episode, Disp: 20 tablet, Rfl: 0 .  metoprolol succinate (TOPROL XL) 25 MG 24 hr tablet, Take 1 tablet (25 mg total) by mouth daily as needed. With Flecainide 300 mg tablet for AFib episodes, Disp: 10 tablet, Rfl: 0 .  omeprazole (PRILOSEC) 20 MG capsule, Take 20 mg by mouth daily., Disp: , Rfl:  .  TURMERIC PO, Take 1 tablet by mouth 2 (two) times daily. , Disp: , Rfl:

## 2017-02-28 NOTE — Patient Instructions (Signed)
For your cough:  Keep taking Prilosec for now We are going to evaluate for asthma with a lung function test, and exhaled nitric oxide test, and a chest x-ray  For the shortness of breath: Workup as above with lung function testing chest x-ray and nitric oxide testing Keep using albuterol on an as-needed basis for chest tightness wheezing or shortness of breath  We will see you back in 1 to 2 weeks after the pulmonary trunk and test is back, over book OK or nurse practitioner visit

## 2017-03-04 ENCOUNTER — Ambulatory Visit (INDEPENDENT_AMBULATORY_CARE_PROVIDER_SITE_OTHER): Payer: No Typology Code available for payment source | Admitting: Pulmonary Disease

## 2017-03-04 DIAGNOSIS — R0600 Dyspnea, unspecified: Secondary | ICD-10-CM

## 2017-03-04 DIAGNOSIS — R059 Cough, unspecified: Secondary | ICD-10-CM

## 2017-03-04 DIAGNOSIS — R05 Cough: Secondary | ICD-10-CM

## 2017-03-04 LAB — PULMONARY FUNCTION TEST
DL/VA % PRED: 100 %
DL/VA: 4.71 ml/min/mmHg/L
DLCO COR % PRED: 93 %
DLCO UNC % PRED: 91 %
DLCO UNC: 21.03 ml/min/mmHg
DLCO cor: 21.36 ml/min/mmHg
FEF 25-75 POST: 2.11 L/s
FEF 25-75 Pre: 2.21 L/sec
FEF2575-%CHANGE-POST: -4 %
FEF2575-%PRED-POST: 102 %
FEF2575-%Pred-Pre: 106 %
FEV1-%CHANGE-POST: 0 %
FEV1-%Pred-Post: 100 %
FEV1-%Pred-Pre: 100 %
FEV1-PRE: 2.33 L
FEV1-Post: 2.32 L
FEV1FVC-%CHANGE-POST: 3 %
FEV1FVC-%PRED-PRE: 102 %
FEV6-%Change-Post: -3 %
FEV6-%Pred-Post: 97 %
FEV6-%Pred-Pre: 101 %
FEV6-Post: 2.85 L
FEV6-Pre: 2.95 L
FEV6FVC-%Change-Post: 0 %
FEV6FVC-%PRED-PRE: 103 %
FEV6FVC-%Pred-Post: 104 %
FVC-%CHANGE-POST: -3 %
FVC-%Pred-Post: 93 %
FVC-%Pred-Pre: 97 %
FVC-PRE: 2.96 L
FVC-Post: 2.85 L
POST FEV1/FVC RATIO: 82 %
PRE FEV6/FVC RATIO: 100 %
Post FEV6/FVC ratio: 100 %
Pre FEV1/FVC ratio: 79 %
RV % PRED: 132 %
RV: 2.7 L
TLC % pred: 114 %
TLC: 5.63 L

## 2017-03-04 NOTE — Progress Notes (Signed)
PFT completed today 03/04/17.  

## 2017-03-07 ENCOUNTER — Ambulatory Visit (INDEPENDENT_AMBULATORY_CARE_PROVIDER_SITE_OTHER): Payer: No Typology Code available for payment source | Admitting: Pulmonary Disease

## 2017-03-07 ENCOUNTER — Encounter: Payer: Self-pay | Admitting: Pulmonary Disease

## 2017-03-07 VITALS — BP 104/56 | HR 59 | Ht 63.0 in | Wt 120.2 lb

## 2017-03-07 DIAGNOSIS — R05 Cough: Secondary | ICD-10-CM

## 2017-03-07 DIAGNOSIS — R938 Abnormal findings on diagnostic imaging of other specified body structures: Secondary | ICD-10-CM | POA: Diagnosis not present

## 2017-03-07 DIAGNOSIS — R9389 Abnormal findings on diagnostic imaging of other specified body structures: Secondary | ICD-10-CM

## 2017-03-07 DIAGNOSIS — R059 Cough, unspecified: Secondary | ICD-10-CM

## 2017-03-07 NOTE — Progress Notes (Signed)
Subjective:    Patient ID: Kathy Patel, female    DOB: 05-08-1951, 66 y.o.   MRN: [id number]  Synopsis: Refer to pulmonary in 2018 for evaluation of dyspnea. Has a past medical history significant for paroxysmal A. fib.  HPI Chief Complaint  Patient presents with  . Follow-up    review PFT and cxr.  breathing is unchanged since last OV.     Kathy Patel returns to clinic today for follow-up of her cough. She says that the symptom has completely resolved. She no longer has trouble with it. She does have some mild shortness of breath with exertion but she is remaining active. She played tennis this morning. No fevers or chills. No chest pain.  Past Medical History:  Diagnosis Date  . Hyperlipidemia   . PAF (paroxysmal atrial fibrillation) (HCC)    s/p ablation 07/15/2015; anticoagulation w/ Eliquis x 1 month      Review of Systems     Objective:   Physical Exam Vitals:   03/07/17 1526  BP: (!) 104/56  Pulse: (!) 59  SpO2: 100%  Weight: 120 lb 3.2 oz (54.5 kg)  Height: 5\' 3"  (1.6 m)   Gen: well appearing HENT: OP clear, TM's clear, neck supple PULM: CTA B, normal percussion CV: RRR, no mgr, trace edema GI: BS+, soft, nontender Derm: no cyanosis or rash Psyche: normal mood and affect   Pulmonary function test: August 2018 ratio 79%, FEV1 2.32 L 100% predicted, FVC 2.85 L 93% predicted, total lung capacity 5.63 L 114% predicted, residual volume 132% predicted, DLCO 21.0 391% predicted  Chest imaging: 05/2016 CT chest images reviewed: Small amount of mucus plugging in the right middle lobe otherwise normal airways bilaterally 02/28/2017 chest x-ray images independently reviewed showing a nonspecific streaky opacity in the lingula, otherwise lungs are clear      Assessment & Plan:  Cough  Abnormal chest x-ray  Discussion: She had a cough which was brought on by respiratory infection and probably exacerbated by postnasal drip and acid reflux. It is now  resolved. She did have a lingular infiltrate seen on the 02/28/2017 chest x-ray. I have personally reviewed these images and it appears to be mild. I think it's likely related to a respiratory infection which she dealt with over the last several weeks. At this time she has no signs or symptoms of pneumonia so she does not need further antibiotics. We will need to repeat a chest x-ray to make sure that the lingular infiltrate clears up. If it does not she will need a CT scan to look for bronchiectasis.  Plan: For your cough: We will get another chest x-ray in about 6 weeks and call you with the results If you have any worsening of symptoms please let me know right away    Current Outpatient Prescriptions:  .  aspirin EC 81 MG tablet, Take 81 mg by mouth daily., Disp: , Rfl:  .  atorvastatin (LIPITOR) 10 MG tablet, Take 10 mg by mouth daily., Disp: , Rfl:  .  Biotin 10 MG CAPS, Take 1 capsule by mouth 2 (two) times daily., Disp: , Rfl:  .  CALCIUM-VITAMIN D PO, Take 1 capsule by mouth daily. , Disp: , Rfl:  .  flecainide (TAMBOCOR) 150 MG tablet, Take 2 tablets (300 mg total) by mouth daily as needed. with Toprol 25 mg tablet for AFib episode, Disp: 20 tablet, Rfl: 0 .  metoprolol succinate (TOPROL XL) 25 MG 24 hr tablet, Take 1 tablet (25  mg total) by mouth daily as needed. With Flecainide 300 mg tablet for AFib episodes, Disp: 10 tablet, Rfl: 0 .  omeprazole (PRILOSEC) 20 MG capsule, Take 20 mg by mouth daily., Disp: , Rfl:  .  TURMERIC PO, Take 1 tablet by mouth 2 (two) times daily. , Disp: , Rfl:

## 2017-03-07 NOTE — Addendum Note (Signed)
Addended by: Velvet BatheAULFIELD, ASHLEY L on: 03/07/2017 04:26 PM   Modules accepted: Orders

## 2017-03-07 NOTE — Patient Instructions (Signed)
For your cough: We will get another chest x-ray in about 6 weeks and call you with the results If you have any worsening of symptoms please let me know right away

## 2017-04-09 ENCOUNTER — Institutional Professional Consult (permissible substitution): Payer: No Typology Code available for payment source | Admitting: Emergency Medicine

## 2017-04-17 ENCOUNTER — Ambulatory Visit (INDEPENDENT_AMBULATORY_CARE_PROVIDER_SITE_OTHER)
Admission: RE | Admit: 2017-04-17 | Discharge: 2017-04-17 | Disposition: A | Discharge status: Home / Self Care | Payer: No Typology Code available for payment source | Source: Ambulatory Visit | Attending: Pulmonary Disease | Admitting: Pulmonary Disease

## 2017-04-17 DIAGNOSIS — R05 Cough: Secondary | ICD-10-CM | POA: Diagnosis not present

## 2017-04-17 DIAGNOSIS — R9389 Abnormal findings on diagnostic imaging of other specified body structures: Secondary | ICD-10-CM

## 2017-04-17 DIAGNOSIS — R938 Abnormal findings on diagnostic imaging of other specified body structures: Secondary | ICD-10-CM

## 2017-04-17 DIAGNOSIS — R059 Cough, unspecified: Secondary | ICD-10-CM

## 2017-07-22 ENCOUNTER — Encounter: Payer: Self-pay | Admitting: Cardiology

## 2017-07-22 ENCOUNTER — Ambulatory Visit: Payer: No Typology Code available for payment source | Admitting: Cardiology

## 2017-07-22 VITALS — BP 106/62 | HR 61 | Ht 63.0 in | Wt 118.0 lb

## 2017-07-22 DIAGNOSIS — I480 Paroxysmal atrial fibrillation: Secondary | ICD-10-CM | POA: Diagnosis not present

## 2017-07-22 NOTE — Patient Instructions (Signed)
Medication Instructions:  Your physician recommends that you continue on your current medications as directed. Please refer to the Current Medication list given to you today.  * If you need a refill on your cardiac medications before your next appointment, please call your pharmacy.   Labwork: None ordered  Testing/Procedures: None ordered  Follow-Up: Your physician wants you to follow-up in: 1 year with Dr. Camnitz.  You will receive a reminder letter in the mail two months in advance. If you don't receive a letter, please call our office to schedule the follow-up appointment.  Thank you for choosing [facility]!!   Rogan Wigley, RN [phone]        

## 2017-07-22 NOTE — Progress Notes (Signed)
Electrophysiology Office Note   Date:  07/22/2017   ID:  Kathy Patel, DOB Apr 13, 1951, MRN [id number]  PCP:  Merri BrunetteSmith, Candace, MD  Primary Electrophysiologist:  Faelyn Sigler Jorja LoaMartin Meshia Rau, MD    Chief Complaint  Patient presents with  . Atrial Fibrillation     History of Present Illness: Kathy Patel is a 66 y.o. female who presents today for electrophysiology evaluation.   She had AF noted when she presented for colonoscopy.  She had multiple episodes of palpitations after that time, and underwent AF ablation on 07/15/15.  She had repeat AF ablation 03/27/16.  Today, denies symptoms of palpitations, chest pain, shortness of breath, orthopnea, PND, lower extremity edema, claudication, dizziness, presyncope, syncope, bleeding, or neurologic sequela. The patient is tolerating medications without difficulties.  She has been feeling well without major complaint.  She recently entered into an Alzheimer's study and has had EKGs, blood work, and brain scans.  They did find that her hemoglobin A1c was slowly rising.  Otherwise she has been doing well.  She is planning to treat that with diet and exercise.   Past Medical History:  Diagnosis Date  . Hyperlipidemia   . PAF (paroxysmal atrial fibrillation) (HCC)    s/p ablation 07/15/2015; anticoagulation w/ Eliquis x 1 month   Past Surgical History:  Procedure Laterality Date  . ATRIAL FIBRILLATION ABLATION  07/15/2015  . ELECTROPHYSIOLOGIC STUDY  07/15/2015      . ELECTROPHYSIOLOGIC STUDY N/A 07/15/2015   Procedure: Atrial Fibrillation Ablation;  Surgeon: Samnang Shugars Jorja LoaMartin Anavictoria Wilk, MD;  Location: [facility];  Service: Cardiovascular;  Laterality: N/A;  . ELECTROPHYSIOLOGIC STUDY N/A 06/27/2016   Procedure: Atrial Fibrillation Ablation;  Surgeon: Somnang Mahan Jorja LoaMartin Jontavious Commons, MD;  Location: [facility] CV [facility];  Service: Cardiovascular;  Laterality: N/A;     Current Outpatient Medications  Medication Sig Dispense Refill  . aspirin EC 81 MG tablet  Take 81 mg by mouth daily.    Marland Kitchen. atorvastatin (LIPITOR) 10 MG tablet Take 10 mg by mouth daily.    . Biotin 10 MG CAPS Take 1 capsule by mouth 2 (two) times daily.    Marland Kitchen. CALCIUM-VITAMIN D PO Take 1 capsule by mouth daily.     . flecainide (TAMBOCOR) 150 MG tablet Take 2 tablets (300 mg total) by mouth daily as needed. with Toprol 25 mg tablet for AFib episode 20 tablet 0  . metoprolol succinate (TOPROL XL) 25 MG 24 hr tablet Take 1 tablet (25 mg total) by mouth daily as needed. With Flecainide 300 mg tablet for AFib episodes 10 tablet 0  . TURMERIC PO Take 1 tablet by mouth 2 (two) times daily.      No current facility-administered medications for this visit.     Allergies:   Patient has no known allergies.   Social History:  The patient  reports that she quit smoking about 40 years ago. Her smoking use included cigarettes. she has never used smokeless tobacco. She reports that she drinks alcohol. She reports that she does not use drugs.   Family History:  The patient's family history includes Alzheimer's disease (age of onset: 7160) in her mother; Diabetes in her father; Heart failure in her maternal grandmother; Hyperlipidemia in her brother; Osteopenia in her sister; Osteoporosis in her mother.    ROS:  Please see the history of present illness.   Otherwise, review of systems is positive for none.   All other systems are reviewed and negative.   PHYSICAL EXAM: VS:  BP 106/62  Pulse 61   Ht 5\' 3"  (1.6 m)   Wt 118 lb (53.5 kg)   SpO2 96%   BMI 20.90 kg/m  , BMI Body mass index is 20.9 kg/m. GEN: Well nourished, well developed, in no acute distress  HEENT: normal  Neck: no JVD, carotid bruits, or masses Cardiac: RRR; no murmurs, rubs, or gallops,no edema  Respiratory:  clear to auscultation bilaterally, normal work of breathing GI: soft, nontender, nondistended, + BS MS: no deformity or atrophy  Skin: warm and dry Neuro:  Strength and sensation are intact Psych: euthymic mood, full  affect  EKG:  EKG is not ordered today. Personal review of the ekg ordered 01/16/17 shows sinus rhythm, rate 68  Recent Labs: No results found for requested labs within last 8760 hours.    Lipid Panel  No results found for: CHOL, TRIG, HDL, CHOLHDL, VLDL, LDLCALC, LDLDIRECT   Wt Readings from Last 3 Encounters:  07/22/17 118 lb (53.5 kg)  03/07/17 120 lb 3.2 oz (54.5 kg)  02/28/17 119 lb (54 kg)      Other studies Reviewed: Additional studies/ records that were reviewed today include: TTE  Review of the above records today demonstrates:  - LVEF 55-60%, normal wall thickness, normal wall motion, mild MR, moderate LAE, moderate TR, RVSP 37 mmHg, normal IVC.    ASSESSMENT AND PLAN:  1.  Atrial fibrillation: Repeat ablation 06/27/16.  Has been doing well.  No further episodes of atrial fibrillation.  We [place] Dube make no further changes today.  Would prefer not to be anticoagulated at this time, which is reasonable with her low burden of atrial fibrillation and ablation.  This patients CHA2DS2-VASc Score and unadjusted Ischemic Stroke Rate (% per year) is equal to 2.2 % stroke rate/year from a score of 2  Above score calculated as 1 point each if present [CHF, HTN, DM, Vascular=MI/PAD/Aortic Plaque, Age if 65-74, or Female] Above score calculated as 2 points each if present [Age > 75, or Stroke/TIA/TE]  Current medicines are reviewed at length with the patient today.   The patient does not have concerns regarding her medicines.  The following changes were made today:  none  Labs/ tests ordered today include:  Orders Placed This Encounter  Procedures  . EKG 12-Lead     Disposition:   FU with Jilliann Subramanian 12 months  Signed, Theresea Trautmann Jorja LoaMartin Gaylon Bentz, MD  07/22/2017 9:50 AM     [facility] [address] [place] [address] [phone] (office) [phone] (fax)

## 2018-02-11 ENCOUNTER — Other Ambulatory Visit (HOSPITAL_COMMUNITY)
Admission: RE | Admit: 2018-02-11 | Discharge: 2018-02-11 | Disposition: A | Discharge status: Home / Self Care | Payer: Medicare Other | Source: Ambulatory Visit | Attending: Family Medicine | Admitting: Family Medicine

## 2018-02-11 ENCOUNTER — Other Ambulatory Visit: Payer: Self-pay | Admitting: Family Medicine

## 2018-02-11 DIAGNOSIS — Z124 Encounter for screening for malignant neoplasm of cervix: Secondary | ICD-10-CM | POA: Diagnosis present

## 2018-02-13 LAB — CYTOLOGY - PAP
Diagnosis: NEGATIVE
HPV (WINDOPATH): NOT DETECTED

## 2018-08-07 ENCOUNTER — Ambulatory Visit: Payer: Medicare Other | Admitting: Cardiology

## 2018-08-07 ENCOUNTER — Encounter: Payer: Self-pay | Admitting: Cardiology

## 2018-08-07 VITALS — BP 102/52 | HR 71 | Ht 63.0 in | Wt 118.0 lb

## 2018-08-07 DIAGNOSIS — I480 Paroxysmal atrial fibrillation: Secondary | ICD-10-CM | POA: Diagnosis not present

## 2018-08-07 MED ORDER — FLECAINIDE ACETATE 150 MG PO TABS: 300.0000 mg | ORAL_TABLET | Freq: Every day | ORAL | 3 refills | Status: DC | PRN

## 2018-08-07 MED ORDER — METOPROLOL SUCCINATE ER 25 MG PO TB24: 25.0000 mg | ORAL_TABLET | Freq: Every day | ORAL | 6 refills | Status: DC | PRN

## 2018-08-07 MED ORDER — APIXABAN 5 MG PO TABS: 5.0000 mg | ORAL_TABLET | Freq: Two times a day (BID) | ORAL | 2 refills | Status: DC

## 2018-08-07 NOTE — Patient Instructions (Signed)
Medication Instructions:  Your physician recommends that you continue on your current medications as directed. Please refer to the Current Medication list given to you today.  * If you need a refill on your cardiac medications before your next appointment, please call your pharmacy.   Labwork: None ordered  Testing/Procedures: None ordered  Follow-Up: Your physician wants you to follow-up in: 1 year with Dr. Camnitz.  You will receive a reminder letter in the mail two months in advance. If you don't receive a letter, please call our office to schedule the follow-up appointment.  Thank you for choosing [facility]!!   Macy Lingenfelter, RN [phone]        

## 2018-08-07 NOTE — Progress Notes (Signed)
Electrophysiology Office Note   Date:  08/07/2018   ID:  Kathy Patel, DOB [birth year], MRN [id number]  PCP:  Kathy Brunette, MD  Primary Electrophysiologist:  Dr Elberta Fortis    CC: Follow up for atrial fibrillation   History of Present Illness: Kathy Patel is a 68 y.o. female who presents today for electrophysiology evaluation.   She had AF noted when she presented for colonoscopy.  She had multiple episodes of palpitations after that time, and underwent AF ablation on 07/15/15.  She had repeat AF ablation 03/27/16.  Today, denies symptoms of chest pain, shortness of breath, orthopnea, PND, lower extremity edema, claudication, dizziness, presyncope, syncope, bleeding, or neurologic sequela. The patient is tolerating medications without difficulties. She does report infrequent palpitations which occur about once per month and last for 1-2 seconds. She has not felt any episodes of atrial fibrillation and has not had to use her PRN flecainide. Overall, she is feeling well since her ablation.    Past Medical History:  Diagnosis Date  . Hyperlipidemia   . PAF (paroxysmal atrial fibrillation) (HCC)    s/p ablation 07/15/2015; anticoagulation w/ Eliquis x 1 month   Past Surgical History:  Procedure Laterality Date  . ATRIAL FIBRILLATION ABLATION  07/15/2015  . ELECTROPHYSIOLOGIC STUDY  07/15/2015      . ELECTROPHYSIOLOGIC STUDY N/A 07/15/2015   Procedure: Atrial Fibrillation Ablation;  Surgeon:  Kathy Loa, MD;  [facility];  Service: Cardiovascular;  Laterality: N/A;  . ELECTROPHYSIOLOGIC STUDY N/A 06/27/2016   Procedure: Atrial Fibrillation Ablation;  Surgeon:  Kathy Loa, MD;  [facility]: [facility];  Service: Cardiovascular;  Laterality: N/A;     Current Outpatient Medications  Medication Sig Dispense Refill  . apixaban (ELIQUIS) 5 MG TABS tablet Take 1 tablet (5 mg total) by mouth 2 (two) times daily. 60 tablet 2  . aspirin EC 81 MG tablet  Take 81 mg by mouth daily.    Marland Kitchen atorvastatin (LIPITOR) 10 MG tablet Take 10 mg by mouth daily.    . Biotin 10 MG CAPS Take 1 capsule by mouth 2 (two) times daily.    Marland Kitchen CALCIUM-VITAMIN D PO Take 1 capsule by mouth daily.     . flecainide (TAMBOCOR) 150 MG tablet Take 2 tablets (300 mg total) by mouth daily as needed for up to 1 dose. with Toprol 25 mg tablet for AFib episode 20 tablet 3  . metoprolol succinate (TOPROL XL) 25 MG 24 hr tablet Take 1 tablet (25 mg total) by mouth daily as needed. With Flecainide 300 mg tablet for AFib episodes 10 tablet 6  . TURMERIC PO Take 1 tablet by mouth 2 (two) times daily.      No current facility-administered medications for this visit.     Allergies:   Patient has no known allergies.   Social History:  The patient  reports that she quit smoking about 41 years ago. Her smoking use included cigarettes. She has never used smokeless tobacco. She reports current alcohol use. She reports that she does not use drugs.   Family History:  The patient's family history includes Alzheimer's disease (age of onset: 13) in her mother; Diabetes in her father; Heart failure in her maternal grandmother; Hyperlipidemia in her brother; Osteopenia in her sister; Osteoporosis in her mother.    ROS:  Please see the history of present illness. All other systems are reviewed and negative.   PHYSICAL EXAM: VS:  BP (!) 102/52   Pulse 71  Ht 5\' 3"  (1.6 m)   Wt 118 lb (53.5 kg)   BMI 20.90 kg/m  , BMI Body mass index is 20.9 kg/m. GEN: Well nourished, well developed, in no acute distress  HEENT: normal  Neck: no JVD, carotid bruits, or masses Cardiac: RRR; no murmurs, rubs, or gallops,no edema  Respiratory:  clear to auscultation bilaterally, normal work of breathing GI: soft, nontender, nondistended, + BS MS: no deformity or atrophy  Skin: warm and dry Neuro:  Strength and sensation are intact Psych: euthymic mood, full affect  EKG:  EKG is ordered today. Personal  review of the ekg ordered shows sinus rhythm HR 71, PR 124, QRS 82, QTc 445   Recent Labs: No results found for requested labs within last 8760 hours.    Lipid Panel  No results found for: CHOL, TRIG, HDL, CHOLHDL, VLDL, LDLCALC, LDLDIRECT   Wt Readings from Last 3 Encounters:  08/07/18 118 lb (53.5 kg)  07/22/17 118 lb (53.5 kg)  03/07/17 120 lb 3.2 oz (54.5 kg)      Other studies Reviewed: Additional studies/ records that were reviewed today include: TTE  Review of the above records today demonstrates:  - LVEF 55-60%, normal wall thickness, normal wall motion, mild MR, moderate LAE, moderate TR, RVSP 37 mmHg, normal IVC.    ASSESSMENT AND PLAN:  1.  Atrial fibrillation: Repeat ablation 06/27/16.  Has been doing well.  No further episodes of atrial fibrillation. Continue PRN flecainide and Toprol.   We discussed her stroke risk and the risks and benefits of anticoagulation. Patient prefers to not be anticoagulated at this point unless she begins having more atrial fibrillation. Prescription given for Eliquis which she  fill if needed.  This patients CHA2DS2-VASc Score and unadjusted Ischemic Stroke Rate (% per year) is equal to 2.2 % stroke rate/year from a score of 2  Above score calculated as 1 point each if present [CHF, HTN, DM, Vascular=MI/PAD/Aortic Plaque, Age if 65-74, or Female] Above score calculated as 2 points each if present [Age > 75, or Stroke/TIA/TE]   Current medicines are reviewed at length with the patient today.   The patient does not have concerns regarding her medicines.  The following changes were made today:  none  Labs/ tests ordered today include:  Orders Placed This Encounter  Procedures  . EKG 12-Lead     Disposition:   FU with   12 months  Kathy Patel, [place]  08/07/2018 11:50 AM     [facility] [address] [place] [address] [phone] (office) [phone] (fax)  I have  seen and examined this patient with Kathy Patel.  Agree with above, note added to reflect my findings.  On exam, RRR, no murmurs, lungs clear.  Patient with no further episodes of atrial fibrillation.  She has been feeling well.  She is planning a trip to [place] for the next 6 months.  We  provide her with PRN flecainide and Eliquis.   M.  MD 08/07/2018 12:27 PM

## 2019-08-25 ENCOUNTER — Ambulatory Visit: Payer: Medicare Other

## 2019-09-03 ENCOUNTER — Ambulatory Visit: Payer: Medicare Other

## 2019-09-11 ENCOUNTER — Ambulatory Visit: Payer: Medicare Other

## 2019-09-28 NOTE — Progress Notes (Signed)
PCP:  Carol Ada, MD Electrophysiologist: Dr. Cheron Every is a 69 y.o. female with past medical history of atrial fibrillation s/p ablation 02/2016 who presents today for routine electrophysiology followup. They are seen for Dr. Curt Bears.   Since last being seen in our clinic, the patient reports doing very well.  She has not needed flecainide or toprol at any point. She is very active, playing sports and jogging without difficulty. She occasionally has lightheadedness with rapid standing, but this is not marked or limiting.   The patient feels that she is tolerating medications without difficulties and is otherwise without complaint today.   Past Medical History:  Diagnosis Date  . Hyperlipidemia   . PAF (paroxysmal atrial fibrillation) ([place])    s/p ablation 07/15/2015; anticoagulation w/ Eliquis x 1 month   Past Surgical History:  Procedure Laterality Date  . ATRIAL FIBRILLATION ABLATION  07/15/2015  . ELECTROPHYSIOLOGIC STUDY  07/15/2015      . ELECTROPHYSIOLOGIC STUDY N/A 07/15/2015   Procedure: Atrial Fibrillation Ablation;  Surgeon: Will Meredith Leeds, MD;  Location: [facility];  Service: Cardiovascular;  Laterality: N/A;  . ELECTROPHYSIOLOGIC STUDY N/A 06/27/2016   Procedure: Atrial Fibrillation Ablation;  Surgeon: Will Meredith Leeds, MD;  Location: [facility];  Service: Cardiovascular;  Laterality: N/A;    Current Outpatient Medications  Medication Sig Dispense Refill  . aspirin EC 81 MG tablet Take 81 mg by mouth daily.    Marland Kitchen atorvastatin (LIPITOR) 10 MG tablet Take 10 mg by mouth daily.    . Biotin 10 MG CAPS Take 1 capsule by mouth 2 (two) times daily.    Marland Kitchen CALCIUM-VITAMIN D PO Take 1 capsule by mouth daily.     . flecainide (TAMBOCOR) 150 MG tablet Take 2 tablets (300 mg total) by mouth daily as needed for up to 1 dose. with Toprol 25 mg tablet for AFib episode 20 tablet 3  . metoprolol succinate (TOPROL XL) 25 MG 24 hr tablet Take 1  tablet (25 mg total) by mouth daily as needed. With Flecainide 300 mg tablet for AFib episodes 10 tablet 6  . TURMERIC PO Take 1 tablet by mouth 2 (two) times daily.      No current facility-administered medications for this visit.    No Known Allergies  Social History   Socioeconomic History  . Marital status: Married    Spouse name: Not on file  . Number of children: Not on file  . Years of education: Not on file  . Highest education level: Not on file  Occupational History  . Not on file  Tobacco Use  . Smoking status: Former Smoker    Types: Cigarettes    Quit date: 02/28/1977    Years since quitting: 42.6  . Smokeless tobacco: Never Used  . Tobacco comment: social smoker per pt  Substance and Sexual Activity  . Alcohol use: Yes    Alcohol/week: 0.0 standard drinks  . Drug use: No  . Sexual activity: Not on file  Other Topics Concern  . Not on file  Social History Narrative  . Not on file   Social Determinants of Health   Financial Resource Strain:   . Difficulty of Paying Living Expenses: Not on file  Food Insecurity:   . Worried About Charity fundraiser in the Last Year: Not on file  . Ran Out of Food in the Last Year: Not on file  Transportation Needs:   . Lack of Transportation (  Medical): Not on file  . Lack of Transportation (Non-Medical): Not on file  Physical Activity:   . Days of Exercise per Week: Not on file  . Minutes of Exercise per Session: Not on file  Stress:   . Feeling of Stress : Not on file  Social Connections:   . Frequency of Communication with Friends and Family: Not on file  . Frequency of Social Gatherings with Friends and Family: Not on file  . Attends Religious Services: Not on file  . Active Member of Clubs or Organizations: Not on file  . Attends Banker Meetings: Not on file  . Marital Status: Not on file  Intimate Partner Violence:   . Fear of Current or Ex-Partner: Not on file  . Emotionally Abused: Not on file   . Physically Abused: Not on file  . Sexually Abused: Not on file     Review of Systems: General: No chills, fever, night sweats or weight changes  Cardiovascular:  No chest pain, dyspnea on exertion, edema, orthopnea, palpitations, paroxysmal nocturnal dyspnea Dermatological: No rash, lesions or masses Respiratory: No cough, dyspnea Urologic: No hematuria, dysuria Abdominal: No nausea, vomiting, diarrhea, bright red blood per rectum, melena, or hematemesis Neurologic: No visual changes, weakness, changes in mental status All other systems reviewed and are otherwise negative except as noted above.  Physical Exam: Vitals:   09/29/19 1002  BP: 100/66  Pulse: 66  SpO2: 96%  Weight: 117 lb 9.6 oz (53.3 kg)  Height: 5\' 3"  (1.6 m)    GEN- The patient is well appearing, alert and oriented x 3 today.   HEENT: normocephalic, atraumatic; sclera clear, conjunctiva pink; hearing intact; oropharynx clear; neck supple, no JVP Lymph- no cervical lymphadenopathy Lungs- Clear to ausculation bilaterally, normal work of breathing.  No wheezes, rales, rhonchi Heart- Regular rate and rhythm, no murmurs, rubs or gallops, PMI not laterally displaced GI- soft, non-tender, non-distended, bowel sounds present, no hepatosplenomegaly Extremities- no clubbing, cyanosis, or edema; DP/PT/radial pulses 2+ bilaterally MS- no significant deformity or atrophy Skin- warm and dry, no rash or lesion Psych- euthymic mood, full affect Neuro- strength and sensation are intact  EKG is not ordered. Pt refused.   Assessment and Plan:  1. Atrial fibrillation S/p repeat ablation 05/2016 Continue prn flecainide and toprol. Will refresh so that they will still be potent in the event she needs She prefers not to be on OAC. CHA2DS2VASC of is at least 2, will continue to rise as she gets older.    No labs today.  RTC 12 months to see Dr. 06/2016. Sooner with symptoms.   Elberta Fortis, PA-C  09/29/19 10:17  AM

## 2019-09-29 ENCOUNTER — Encounter: Payer: Self-pay | Admitting: Student

## 2019-09-29 ENCOUNTER — Ambulatory Visit: Payer: Medicare PPO | Admitting: Student

## 2019-09-29 ENCOUNTER — Other Ambulatory Visit: Payer: Self-pay

## 2019-09-29 VITALS — BP 100/66 | HR 66 | Ht 63.0 in | Wt 117.6 lb

## 2019-09-29 DIAGNOSIS — I480 Paroxysmal atrial fibrillation: Secondary | ICD-10-CM

## 2019-09-29 MED ORDER — METOPROLOL SUCCINATE ER 25 MG PO TB24: 25.0000 mg | ORAL_TABLET | Freq: Every day | ORAL | 6 refills | Status: DC | PRN

## 2019-09-29 MED ORDER — FLECAINIDE ACETATE 150 MG PO TABS: 300.0000 mg | ORAL_TABLET | Freq: Every day | ORAL | 3 refills | Status: DC | PRN

## 2019-09-29 NOTE — Patient Instructions (Signed)
Medication Instructions:  none *If you need a refill on your cardiac medications before your next appointment, please call your pharmacy*   Lab Work: none If you have labs (blood work) drawn today and your tests are completely normal, you will receive your results only by: Marland Kitchen MyChart Message (if you have MyChart) OR . A paper copy in the mail If you have any lab test that is abnormal or we need to change your treatment, we will call you to review the results.   Testing/Procedures: none   Follow-Up: At [facility], you and your health needs are our priority.  As part of our continuing mission to provide you with exceptional heart care, we have created designated Provider Care Teams.  These Care Teams include your primary Cardiologist (physician) and Advanced Practice Providers (APPs -  Physician Assistants and Nurse Practitioners) who all work together to provide you with the care you need, when you need it.  We recommend signing up for the patient portal called "MyChart".  Sign up information is provided on this After Visit Summary.  MyChart is used to connect with patients for Virtual Visits (Telemedicine).  Patients are able to view lab/test results, encounter notes, upcoming appointments, etc.  Non-urgent messages can be sent to your provider as well.   To learn more about what you can do with MyChart, go to [email or web address].    Your next appointment:   1 year(s)  The format for your next appointment:   Either In Person or Virtual  Provider:   Dr Elberta Fortis   Other Instructions

## 2019-12-03 DIAGNOSIS — R944 Abnormal results of kidney function studies: Secondary | ICD-10-CM | POA: Diagnosis not present

## 2019-12-03 DIAGNOSIS — R739 Hyperglycemia, unspecified: Secondary | ICD-10-CM | POA: Diagnosis not present

## 2020-04-28 DIAGNOSIS — H2513 Age-related nuclear cataract, bilateral: Secondary | ICD-10-CM | POA: Diagnosis not present

## 2020-04-28 DIAGNOSIS — H11153 Pinguecula, bilateral: Secondary | ICD-10-CM | POA: Diagnosis not present

## 2020-04-28 DIAGNOSIS — Z9889 Other specified postprocedural states: Secondary | ICD-10-CM | POA: Diagnosis not present

## 2020-07-04 DIAGNOSIS — Z1231 Encounter for screening mammogram for malignant neoplasm of breast: Secondary | ICD-10-CM | POA: Diagnosis not present

## 2020-07-13 DIAGNOSIS — D225 Melanocytic nevi of trunk: Secondary | ICD-10-CM | POA: Diagnosis not present

## 2020-07-13 DIAGNOSIS — L821 Other seborrheic keratosis: Secondary | ICD-10-CM | POA: Diagnosis not present

## 2020-07-13 DIAGNOSIS — L578 Other skin changes due to chronic exposure to nonionizing radiation: Secondary | ICD-10-CM | POA: Diagnosis not present

## 2020-08-09 DIAGNOSIS — H2513 Age-related nuclear cataract, bilateral: Secondary | ICD-10-CM | POA: Diagnosis not present

## 2020-08-09 DIAGNOSIS — Z9889 Other specified postprocedural states: Secondary | ICD-10-CM | POA: Diagnosis not present

## 2020-08-09 DIAGNOSIS — H11153 Pinguecula, bilateral: Secondary | ICD-10-CM | POA: Diagnosis not present

## 2020-08-12 DIAGNOSIS — H2513 Age-related nuclear cataract, bilateral: Secondary | ICD-10-CM | POA: Diagnosis not present

## 2020-08-18 DIAGNOSIS — H2511 Age-related nuclear cataract, right eye: Secondary | ICD-10-CM | POA: Diagnosis not present

## 2021-01-04 DIAGNOSIS — R059 Cough, unspecified: Secondary | ICD-10-CM | POA: Diagnosis not present

## 2021-03-21 DIAGNOSIS — E7800 Pure hypercholesterolemia, unspecified: Secondary | ICD-10-CM | POA: Diagnosis not present

## 2021-03-21 DIAGNOSIS — N3281 Overactive bladder: Secondary | ICD-10-CM | POA: Diagnosis not present

## 2021-03-21 DIAGNOSIS — R7303 Prediabetes: Secondary | ICD-10-CM | POA: Diagnosis not present

## 2021-05-02 DIAGNOSIS — Z961 Presence of intraocular lens: Secondary | ICD-10-CM | POA: Diagnosis not present

## 2021-05-02 DIAGNOSIS — Z9889 Other specified postprocedural states: Secondary | ICD-10-CM | POA: Diagnosis not present

## 2021-05-02 DIAGNOSIS — H11153 Pinguecula, bilateral: Secondary | ICD-10-CM | POA: Diagnosis not present

## 2021-05-02 DIAGNOSIS — H43392 Other vitreous opacities, left eye: Secondary | ICD-10-CM | POA: Diagnosis not present

## 2021-05-02 DIAGNOSIS — H2512 Age-related nuclear cataract, left eye: Secondary | ICD-10-CM | POA: Diagnosis not present

## 2021-05-22 DIAGNOSIS — M1711 Unilateral primary osteoarthritis, right knee: Secondary | ICD-10-CM | POA: Diagnosis not present

## 2021-05-29 DIAGNOSIS — R7303 Prediabetes: Secondary | ICD-10-CM | POA: Diagnosis not present

## 2021-05-29 DIAGNOSIS — Z0000 Encounter for general adult medical examination without abnormal findings: Secondary | ICD-10-CM | POA: Diagnosis not present

## 2021-05-29 DIAGNOSIS — E7800 Pure hypercholesterolemia, unspecified: Secondary | ICD-10-CM | POA: Diagnosis not present

## 2021-05-29 DIAGNOSIS — G4700 Insomnia, unspecified: Secondary | ICD-10-CM | POA: Diagnosis not present

## 2021-05-29 DIAGNOSIS — Z1389 Encounter for screening for other disorder: Secondary | ICD-10-CM | POA: Diagnosis not present

## 2021-05-29 DIAGNOSIS — N3281 Overactive bladder: Secondary | ICD-10-CM | POA: Diagnosis not present

## 2021-05-30 DIAGNOSIS — M25561 Pain in right knee: Secondary | ICD-10-CM | POA: Diagnosis not present

## 2021-06-08 DIAGNOSIS — M2241 Chondromalacia patellae, right knee: Secondary | ICD-10-CM | POA: Diagnosis not present

## 2021-06-10 DIAGNOSIS — M2241 Chondromalacia patellae, right knee: Secondary | ICD-10-CM | POA: Insufficient documentation

## 2021-07-01 DIAGNOSIS — H2512 Age-related nuclear cataract, left eye: Secondary | ICD-10-CM | POA: Diagnosis not present

## 2021-07-04 DIAGNOSIS — L821 Other seborrheic keratosis: Secondary | ICD-10-CM | POA: Diagnosis not present

## 2021-07-04 DIAGNOSIS — D225 Melanocytic nevi of trunk: Secondary | ICD-10-CM | POA: Diagnosis not present

## 2021-07-04 DIAGNOSIS — L578 Other skin changes due to chronic exposure to nonionizing radiation: Secondary | ICD-10-CM | POA: Diagnosis not present

## 2021-07-06 DIAGNOSIS — H2512 Age-related nuclear cataract, left eye: Secondary | ICD-10-CM | POA: Diagnosis not present

## 2021-07-11 DIAGNOSIS — M25561 Pain in right knee: Secondary | ICD-10-CM | POA: Diagnosis not present

## 2021-08-01 DIAGNOSIS — M25561 Pain in right knee: Secondary | ICD-10-CM | POA: Diagnosis not present

## 2021-08-01 DIAGNOSIS — M222X1 Patellofemoral disorders, right knee: Secondary | ICD-10-CM | POA: Diagnosis not present

## 2021-09-28 DIAGNOSIS — J301 Allergic rhinitis due to pollen: Secondary | ICD-10-CM | POA: Diagnosis not present

## 2021-09-28 DIAGNOSIS — R051 Acute cough: Secondary | ICD-10-CM | POA: Diagnosis not present

## 2021-11-28 ENCOUNTER — Ambulatory Visit: Payer: Medicare PPO | Admitting: Allergy and Immunology

## 2021-11-28 ENCOUNTER — Encounter: Payer: Self-pay | Admitting: Allergy and Immunology

## 2021-11-28 VITALS — BP 102/70 | HR 61 | Temp 98.2°F | Resp 16 | Ht 63.0 in | Wt 125.2 lb

## 2021-11-28 DIAGNOSIS — J4540 Moderate persistent asthma, uncomplicated: Secondary | ICD-10-CM

## 2021-11-28 DIAGNOSIS — K219 Gastro-esophageal reflux disease without esophagitis: Secondary | ICD-10-CM | POA: Diagnosis not present

## 2021-11-28 DIAGNOSIS — J310 Chronic rhinitis: Secondary | ICD-10-CM

## 2021-11-28 DIAGNOSIS — J3089 Other allergic rhinitis: Secondary | ICD-10-CM | POA: Diagnosis not present

## 2021-11-28 MED ORDER — BUDESONIDE-FORMOTEROL FUMARATE 160-4.5 MCG/ACT IN AERO: 2.0000 | INHALATION_SPRAY | Freq: Every day | RESPIRATORY_TRACT | 5 refills | Status: DC

## 2021-11-28 MED ORDER — LORATADINE 10 MG PO TABS: 10.0000 mg | ORAL_TABLET | Freq: Two times a day (BID) | ORAL | 5 refills | Status: DC | PRN

## 2021-11-28 MED ORDER — ALBUTEROL SULFATE HFA 108 (90 BASE) MCG/ACT IN AERS: 2.0000 | INHALATION_SPRAY | RESPIRATORY_TRACT | 1 refills | Status: DC | PRN

## 2021-11-28 MED ORDER — OMEPRAZOLE 40 MG PO CPDR: 40.0000 mg | DELAYED_RELEASE_CAPSULE | Freq: Every day | ORAL | 5 refills | Status: DC

## 2021-11-28 MED ORDER — IPRATROPIUM BROMIDE 0.06 % NA SOLN: 2.0000 | Freq: Four times a day (QID) | NASAL | 5 refills | Status: DC | PRN

## 2021-11-28 NOTE — Progress Notes (Signed)
? - [employer] [facility] [place] [facility] [place] [facility] [place] ? ? ?Dear Dr. Katrinka Blazing, ? ?Thank you for referring Kathy Patel to the [facility] [place] on 11/28/2021.  ? ?Below is a summation of this patient's evaluation and recommendations. ? ?Thank you for your referral. I will keep you informed about this patient's response to treatment.  ? ?If you have any questions please do not hesitate to contact me.  ? ?Sincerely, ? ?Jessica Priest, MD ?Allergy / Immunology ?[facility] [place] ? ? ?______________________________________________________________________ ? ? ? ?NEW PATIENT NOTE ? ?Referring Provider: Merri Brunette, MD ?Primary Provider: Merri Brunette, MD ?Date of office visit: 11/28/2021 ?   ?Subjective:  ? ?Chief Complaint:  Kathy Patel (DOB: Aug 24, 1950) is a 71 y.o. female who presents to the clinic on 11/28/2021 with a chief complaint of Cough (Past ten years she has had a cough a couple times a year. Went to see a pulmonologist in 2018. Says the cough last for a couple of months. PCP gave inhaler but doesn't seem to help too much. ) ?.    ? ?HPI: Kathy Patel presents to this clinic in evaluation of breathing problems and cough.  She has seen Dr. Kendrick Fries, pulmonary, in 2018 for this problem. ? ?It appears that Kathy Patel has 3 different types of respiratory tract issues. ? ?First, she appears to have some shortness of breath and chest tightness when she exerts herself such as hiking or playing pickle ball.  This exercise-induced respiratory tract problem has prevented her from continuing her running hobby.  This respiratory issue does appear to respond to the administration of a short acting bronchodilator. ? ?Second, she has unrelenting spells of cough associated with gagging that occur 1 or 2 times per year and can last up to several months.  She has had a series of various tests performed by Dr. Kendrick Fries in evaluation of this type of cough.   Usually she ends up using a codeine-based cough medicine which does help but she does not like to use this medication very often. ? ?Third, she has intermittent cough that will last a week or 2 without any obvious precipitant without any other associated respiratory tract symptoms that is not as severe as her spells of cough associated with gagging and may be precipitated by exposure to various irritants in the air and grass and tree pollen exposure. ? ?Kathy Patel also appears to have an issue with chronic runny nose.  She always has Kleenex available as her nose runs all the time especially if she is eating warm food or if she goes out in the cold.  As well, she will sometimes sneeze if she has exposure to pepper in foods. ? ?She does not have any classic reflux symptoms.  She has used omeprazole during some of her coughing spells.  She does drink 3 coffees in the morning and has a Coke 0 in the morning and has no caffeine after 1 PM.  She does have chocolate a few times per week.  She drinks alcohol with a glass of wine most nights. ? ?Past Medical History:  ?Diagnosis Date  ? Hyperlipidemia   ? PAF (paroxysmal atrial fibrillation) (HCC)   ? s/p ablation 07/15/2015; anticoagulation w/ Eliquis x 1 month  ? ? ?Past Surgical History:  ?Procedure Laterality Date  ? ATRIAL FIBRILLATION ABLATION  07/15/2015  ? ELECTROPHYSIOLOGIC STUDY  07/15/2015  ?    ? ELECTROPHYSIOLOGIC STUDY N/A 07/15/2015  ?  Procedure: Atrial Fibrillation Ablation;  Surgeon: Will Jorja LoaMartin Camnitz, MD;  [facility];  Service: Cardiovascular;  Laterality: N/A;  ? ELECTROPHYSIOLOGIC STUDY N/A 06/27/2016  ? Procedure: Atrial Fibrillation Ablation;  Surgeon: Will Jorja LoaMartin Camnitz, MD;  [facility];  Service: Cardiovascular;  Laterality: N/A;  ? ? ?Allergies as of 11/28/2021   ?No Known Allergies ?  ? ?  ?Medication List  ? ? ?albuterol 108 (90 Base) MCG/ACT inhaler ?Commonly known as: VENTOLIN HFA ?1 puff as needed Inhalation  every 4 hrs ?  ?atorvastatin 10 MG tablet ?Commonly known as: LIPITOR ?Take 10 mg by mouth daily. ?  ?Biotin 10 MG Caps ?Take 1 capsule by mouth 2 (two) times daily. ?  ?CALCIUM-VITAMIN D PO ?Take 1 capsule by mouth daily. ?  ?melatonin 3 MG Tabs tablet ?1 tablet at bedtime as needed ?  ?Myrbetriq 25 MG Tb24 tablet ?Generic drug: mirabegron ER ?Take 25 mg by mouth daily. ?  ?TURMERIC PO ?Take 1 tablet by mouth 2 (two) times daily. ?  ? ?Review of systems negative except as noted in HPI / PMHx or noted below: ? ?Review of Systems  ?Constitutional: Negative.   ?HENT: Negative.    ?Eyes: Negative.   ?Respiratory: Negative.    ?Cardiovascular: Negative.   ?Gastrointestinal: Negative.   ?Genitourinary: Negative.   ?Musculoskeletal: Negative.   ?Skin: Negative.   ?Neurological: Negative.   ?Endo/Heme/Allergies: Negative.   ?Psychiatric/Behavioral: Negative.    ? ?Family History  ?Problem Relation Age of Onset  ? Alzheimer's disease Mother 5760  ? Osteoporosis Mother   ? Diabetes Father   ? Osteopenia Sister   ? Hyperlipidemia Brother   ? Heart failure Maternal Grandmother   ? ? ?Social History  ? ?Socioeconomic History  ? Marital status: Married  ?  Spouse name: Not on file  ? Number of children: Not on file  ? Years of education: Not on file  ? Highest education level: Not on file  ?Occupational History  ? Not on file  ?Tobacco Use  ? Smoking status: Former  ?  Types: Cigarettes  ?  Quit date: 02/28/1977  ?  Years since quitting: 44.7  ? Smokeless tobacco: Never  ? Tobacco comments:  ?  social smoker per pt  ?Vaping Use  ? Vaping Use: Never used  ?Substance and Sexual Activity  ? Alcohol use: Yes  ?  Alcohol/week: 0.0 standard drinks  ? Drug use: No  ? Sexual activity: Yes  ?Other Topics Concern  ? Not on file  ?Social History Narrative  ? Not on file  ? ?Environmental and Social history ? ?Lives in a house with a dry environment, no animals located inside the household, carpet in the bedroom, no plastic on the bed, no  plastic on the pillow, no smoking ongoing with inside the household. ? ?Objective:  ? ?Vitals:  ? 11/28/21 1136  ?BP: 102/70  ?Pulse: 61  ?Resp: 16  ?Temp: 98.2 ?F (36.8 ?C)  ?SpO2: 99%  ? ?Height: 5\' 3"  (160 cm) ?Weight: 125 lb 3.2 oz (56.8 kg) ? ?Physical Exam ?Constitutional:   ?   Appearance: She is not diaphoretic.  ?HENT:  ?   Head: Normocephalic.  ?   Right Ear: Tympanic membrane, ear canal and external ear normal.  ?   Left Ear: Tympanic membrane, ear canal and external ear normal.  ?   Nose: Nose normal. No mucosal edema or rhinorrhea.  ?   Mouth/Throat:  ?   Pharynx:  Uvula midline. No oropharyngeal exudate.  ?Eyes:  ?   Conjunctiva/sclera: Conjunctivae normal.  ?Neck:  ?   Thyroid: No thyromegaly.  ?   Trachea: Trachea normal. No tracheal tenderness or tracheal deviation.  ?Cardiovascular:  ?   Rate and Rhythm: Normal rate and regular rhythm.  ?   Heart sounds: Normal heart sounds, S1 normal and S2 normal. No murmur heard. ?Pulmonary:  ?   Effort: No respiratory distress.  ?   Breath sounds: Normal breath sounds. No stridor. No wheezing or rales.  ?Lymphadenopathy:  ?   Head:  ?   Right side of head: No tonsillar adenopathy.  ?   Left side of head: No tonsillar adenopathy.  ?   Cervical: No cervical adenopathy.  ?Skin: ?   Findings: No erythema or rash.  ?   Nails: There is no clubbing.  ?Neurological:  ?   Mental Status: She is alert.  ? ? ?Diagnostics: Allergy skin tests were performed.  She demonstrated hypersensitivity to tree pollen. ? ?Spirometry was performed and demonstrated an FEV1 of 1.89 @ 88 % of predicted. FEV1/FVC = 0.81 ? ?Results of lung volumes obtained 04 March 2017 identifies TLC 114% predicted, RV 132% predicted, DL/VA 675% predicted. ? ?Results of a brain MRI obtained 03 January 2016 identified the following: ? ?Mastoids and paranasal sinuses are well pneumatized, minimal paranasal sinus mucosal thickening. ? ?Results of a chest x-ray obtained 17 April 2017 identified the  following: ? ?The heart size and mediastinal contours are within normal limits. ?Stable pulmonary hyperinflation. Mild scarring again seen in the ?right middle lobe and left lung base. No evidence pulmonary ?infiltrate or ed

## 2021-11-28 NOTE — Patient Instructions (Addendum)
?  1.  Allergen avoidance measures - pollen ? ?2. Treat and prevent inflammation: ? ?A. Symbicort 160 - 2 inhalations 1 time per day w/spacer (empty lungs) ? ?3. Treat and prevent reflux / LPR: ? ?A. Decrease milligrams of caffeine per day ?B. Omeprazole 40 mg - 1 tablet 1 time per day ? ?4. If needed: ? ?A. Albuterol HFA - 2 inhalations every 4-6 hours ?B. Nasal ipratropium 0.06% - 2 sprays each nostril every 6 hours to dry nose ? ?5. "Action Plan" for flare up: ? ?A. Increase Symbicort to 2 times per day ?B. Increase omeprazole to 2 times per day ?C. Start mucinex DM - 2 times per day ?D. Start loratadine 10 mg - 2 times per day ? ?6. Return to clinic in 8 weeks or earlier if problem  ?

## 2021-11-29 ENCOUNTER — Encounter: Payer: Self-pay | Admitting: Allergy and Immunology

## 2021-12-05 DIAGNOSIS — Z1231 Encounter for screening mammogram for malignant neoplasm of breast: Secondary | ICD-10-CM | POA: Diagnosis not present

## 2022-01-01 DIAGNOSIS — Z961 Presence of intraocular lens: Secondary | ICD-10-CM | POA: Diagnosis not present

## 2022-01-01 DIAGNOSIS — H11153 Pinguecula, bilateral: Secondary | ICD-10-CM | POA: Diagnosis not present

## 2022-01-01 DIAGNOSIS — H43392 Other vitreous opacities, left eye: Secondary | ICD-10-CM | POA: Diagnosis not present

## 2022-02-05 DIAGNOSIS — G4700 Insomnia, unspecified: Secondary | ICD-10-CM | POA: Insufficient documentation

## 2022-02-05 DIAGNOSIS — H9313 Tinnitus, bilateral: Secondary | ICD-10-CM | POA: Insufficient documentation

## 2022-02-05 DIAGNOSIS — R7303 Prediabetes: Secondary | ICD-10-CM | POA: Insufficient documentation

## 2022-02-05 DIAGNOSIS — J301 Allergic rhinitis due to pollen: Secondary | ICD-10-CM | POA: Insufficient documentation

## 2022-02-05 DIAGNOSIS — J45909 Unspecified asthma, uncomplicated: Secondary | ICD-10-CM | POA: Insufficient documentation

## 2022-02-05 DIAGNOSIS — I493 Ventricular premature depolarization: Secondary | ICD-10-CM | POA: Insufficient documentation

## 2022-02-05 DIAGNOSIS — E7800 Pure hypercholesterolemia, unspecified: Secondary | ICD-10-CM | POA: Insufficient documentation

## 2022-02-05 DIAGNOSIS — N3281 Overactive bladder: Secondary | ICD-10-CM | POA: Insufficient documentation

## 2022-02-05 DIAGNOSIS — M8580 Other specified disorders of bone density and structure, unspecified site: Secondary | ICD-10-CM | POA: Insufficient documentation

## 2022-02-06 ENCOUNTER — Encounter: Payer: Self-pay | Admitting: Allergy and Immunology

## 2022-02-06 ENCOUNTER — Ambulatory Visit: Payer: Medicare PPO | Admitting: Allergy and Immunology

## 2022-02-06 VITALS — BP 68/50 | HR 74 | Temp 97.6°F | Resp 16 | Ht 63.39 in | Wt 121.8 lb

## 2022-02-06 DIAGNOSIS — J310 Chronic rhinitis: Secondary | ICD-10-CM

## 2022-02-06 DIAGNOSIS — J4540 Moderate persistent asthma, uncomplicated: Secondary | ICD-10-CM

## 2022-02-06 DIAGNOSIS — J3089 Other allergic rhinitis: Secondary | ICD-10-CM | POA: Diagnosis not present

## 2022-02-06 DIAGNOSIS — K219 Gastro-esophageal reflux disease without esophagitis: Secondary | ICD-10-CM | POA: Diagnosis not present

## 2022-02-06 MED ORDER — ALBUTEROL SULFATE HFA 108 (90 BASE) MCG/ACT IN AERS
2.0000 | INHALATION_SPRAY | RESPIRATORY_TRACT | 1 refills | Status: DC | PRN
Start: 2022-02-06 — End: 2022-08-07

## 2022-02-06 MED ORDER — BUDESONIDE-FORMOTEROL FUMARATE 160-4.5 MCG/ACT IN AERO: 2.0000 | INHALATION_SPRAY | Freq: Two times a day (BID) | RESPIRATORY_TRACT | 5 refills | Status: DC

## 2022-02-06 MED ORDER — SPACER/AERO-HOLDING CHAMBERS DEVI: 1.0000 | Freq: Every day | 2 refills | Status: DC

## 2022-02-06 MED ORDER — IPRATROPIUM BROMIDE 0.06 % NA SOLN: 2.0000 | Freq: Four times a day (QID) | NASAL | 5 refills | Status: DC

## 2022-02-06 NOTE — Patient Instructions (Addendum)
  1.  Allergen avoidance measures - pollen  2. Treat and prevent inflammation:  A. Symbicort 160 - 2 inhalations 1-2 times per day w/spacer (empty lungs)  3. Treat and prevent reflux / LPR:  A. Decrease milligrams of caffeine per day  4. If needed:  A. Albuterol HFA - 2 inhalations every 4-6 hours B. Nasal ipratropium 0.06% - 2 sprays each nostril every 6 hours to dry nose  5. "Action Plan" for flare up:  A. Increase Symbicort to 2 times per day B. Start omeprazole 40 mg - 2 times per day C. Start mucinex DM - 2 times per day D. Start loratadine 10 mg - 2 times per day  6. Return to clinic in 6 months or earlier if problem

## 2022-02-06 NOTE — Progress Notes (Signed)
[place] - [place] - [place] - [place] - [place]   Follow-up Note  Referring Provider: Merri Brunette, MD Primary Provider: Merri Brunette, MD Date of Office Visit: 02/06/2022  Subjective:   Kathy Patel (DOB: 1951/01/15) is a 71 y.o. female who [facility] in re-evaluation of the following:  HPI: Rylin returns to this clinic in evaluation of asthma, allergic rhinitis, gustatory rhinitis, and LPR.  I last saw her in this clinic during her initial evaluation of 28 Nov 2021.  She has resolved her cough.  She has no throat clearing and no drainage.  She is now able to exercise without any difficulty and plans on a prolonged bicycle trip this summer.  She has had very little issues with her upper airway.  She has consolidated her coffee consumption by about 50% using a half caff mix.  She is currently using Symbicort just 1 time per day.  She is no longer using omeprazole.  She has no need to use albuterol or nasal ipratropium.  Allergies as of 02/06/2022   No Known Allergies      Medication List    albuterol 108 (90 Base) MCG/ACT inhaler Commonly known as: VENTOLIN HFA Inhale 2 puffs into the lungs every 4 (four) hours as needed for wheezing or shortness of breath.   atorvastatin 10 MG tablet Commonly known as: LIPITOR Take 10 mg by mouth daily.   Biotin 10 MG Caps Take 1 capsule by mouth 2 (two) times daily.   budesonide-formoterol 160-4.5 MCG/ACT inhaler Commonly known as: Symbicort Inhale 2 puffs into the lungs daily. With spacer   CALCIUM-VITAMIN D PO Take 1 capsule by mouth daily.   melatonin 3 MG Tabs tablet 1 tablet at bedtime as needed   Myrbetriq 25 MG Tb24 tablet Generic drug: mirabegron ER Take 25 mg by mouth daily.   omeprazole 40 MG capsule Commonly known as: PRILOSEC Take 1 capsule (40 mg total) by mouth daily.   TURMERIC PO Take 1 tablet by mouth 2 (two) times daily.    Past Medical  History:  Diagnosis Date   Hyperlipidemia    PAF (paroxysmal atrial fibrillation) (HCC)    s/p ablation 07/15/2015; anticoagulation w/ Eliquis x 1 month    Past Surgical History:  Procedure Laterality Date   ATRIAL FIBRILLATION ABLATION  07/15/2015   ELECTROPHYSIOLOGIC STUDY  07/15/2015       ELECTROPHYSIOLOGIC STUDY N/A 07/15/2015   Procedure: Atrial Fibrillation Ablation;  Surgeon: Will Jorja Loa, MD;  [facility];  Service: Cardiovascular;  Laterality: N/A;   ELECTROPHYSIOLOGIC STUDY N/A 06/27/2016   Procedure: Atrial Fibrillation Ablation;  Surgeon: Will Jorja Loa, MD;  Location: [facility];  Service: Cardiovascular;  Laterality: N/A;    Review of systems negative except as noted in HPI / PMHx or noted below:  Review of Systems  Constitutional: Negative.   HENT: Negative.    Eyes: Negative.   Respiratory: Negative.    Cardiovascular: Negative.   Gastrointestinal: Negative.   Genitourinary: Negative.   Musculoskeletal: Negative.   Skin: Negative.   Neurological: Negative.   Endo/Heme/Allergies: Negative.   Psychiatric/Behavioral: Negative.       Objective:   Vitals:   02/06/22 0933  BP: (!) 68/50  Pulse: 74  Resp: 16  Temp: 97.6 F (36.4 C)  SpO2: 97%   Height: 5' 3.39" (161 cm)  Weight: 121 lb 12.8 oz (55.2 kg)   Physical Exam Constitutional:  Appearance: She is not diaphoretic.  HENT:     Head: Normocephalic.     Right Ear: Tympanic membrane, ear canal and external ear normal.     Left Ear: Tympanic membrane, ear canal and external ear normal.     Nose: Nose normal. No mucosal edema or rhinorrhea.     Mouth/Throat:     Pharynx: Uvula midline. No oropharyngeal exudate.  Eyes:     Conjunctiva/sclera: Conjunctivae normal.  Neck:     Thyroid: No thyromegaly.     Trachea: Trachea normal. No tracheal tenderness or tracheal deviation.  Cardiovascular:     Rate and Rhythm: Normal rate and regular rhythm.     Heart  sounds: Normal heart sounds, S1 normal and S2 normal. No murmur heard. Pulmonary:     Effort: No respiratory distress.     Breath sounds: Normal breath sounds. No stridor. No wheezing or rales.  Lymphadenopathy:     Head:     Right side of head: No tonsillar adenopathy.     Left side of head: No tonsillar adenopathy.     Cervical: No cervical adenopathy.  Skin:    Findings: No erythema or rash.     Nails: There is no clubbing.  Neurological:     Mental Status: She is alert.     Diagnostics:    Spirometry was performed and demonstrated an FEV1 of 1.96 at 92 % of predicted.  Assessment and Plan:   1. Asthma, moderate persistent, well-controlled   2. Perennial allergic rhinitis   3. Gustatory rhinitis   4. LPRD (laryngopharyngeal reflux disease)    1.  Allergen avoidance measures - pollen  2. Treat and prevent inflammation:  A. Symbicort 160 - 2 inhalations 1-2 times per day w/spacer (empty lungs)  3. Treat and prevent reflux / LPR:  A. Decrease milligrams of caffeine per day  4. If needed:  A. Albuterol HFA - 2 inhalations every 4-6 hours B. Nasal ipratropium 0.06% - 2 sprays each nostril every 6 hours to dry nose  5. "Action Plan" for flare up:  A. Increase Symbicort to 2 times per day B. Start omeprazole 40 mg - 2 times per day C. Start mucinex DM - 2 times per day D. Start loratadine 10 mg - 2 times per day  6. Return to clinic in 6 months or earlier if problem   Kathy Patel is doing a lot better at this point in time and she will continue to use a relatively low-dose of Symbicort and she has a "action plan" to initiate should she develop a respiratory tract flare in the future.  Given the fact that there appears to be a component of reflux induced respiratory disease that intermittently presents especially in the context of viral induced respiratory tract infections we will have her use a proton pump inhibitor during these flareups.  Assuming she does well with this  plan I will see her back in this clinic in 6 months or earlier if there is a problem.  I did make the recommendation that she consider obtaining a flu vaccine but this is not something that she has done in the past nor will be doing in the future.  Laurette Schimke, MD Allergy / Immunology [facility]

## 2022-02-07 ENCOUNTER — Encounter: Payer: Self-pay | Admitting: Allergy and Immunology

## 2022-02-14 DIAGNOSIS — H43812 Vitreous degeneration, left eye: Secondary | ICD-10-CM | POA: Diagnosis not present

## 2022-02-14 DIAGNOSIS — Z961 Presence of intraocular lens: Secondary | ICD-10-CM | POA: Diagnosis not present

## 2022-02-14 DIAGNOSIS — H04123 Dry eye syndrome of bilateral lacrimal glands: Secondary | ICD-10-CM | POA: Diagnosis not present

## 2022-02-14 DIAGNOSIS — H11153 Pinguecula, bilateral: Secondary | ICD-10-CM | POA: Diagnosis not present

## 2022-02-14 DIAGNOSIS — H43392 Other vitreous opacities, left eye: Secondary | ICD-10-CM | POA: Diagnosis not present

## 2022-06-14 DIAGNOSIS — R7303 Prediabetes: Secondary | ICD-10-CM | POA: Diagnosis not present

## 2022-06-14 DIAGNOSIS — M8580 Other specified disorders of bone density and structure, unspecified site: Secondary | ICD-10-CM | POA: Diagnosis not present

## 2022-06-14 DIAGNOSIS — Z0000 Encounter for general adult medical examination without abnormal findings: Secondary | ICD-10-CM | POA: Diagnosis not present

## 2022-06-14 DIAGNOSIS — Z1331 Encounter for screening for depression: Secondary | ICD-10-CM | POA: Diagnosis not present

## 2022-06-14 DIAGNOSIS — E2839 Other primary ovarian failure: Secondary | ICD-10-CM | POA: Diagnosis not present

## 2022-06-14 DIAGNOSIS — E7800 Pure hypercholesterolemia, unspecified: Secondary | ICD-10-CM | POA: Diagnosis not present

## 2022-06-14 DIAGNOSIS — Z1159 Encounter for screening for other viral diseases: Secondary | ICD-10-CM | POA: Diagnosis not present

## 2022-06-14 DIAGNOSIS — N951 Menopausal and female climacteric states: Secondary | ICD-10-CM | POA: Diagnosis not present

## 2022-06-14 DIAGNOSIS — N3281 Overactive bladder: Secondary | ICD-10-CM | POA: Diagnosis not present

## 2022-07-05 DIAGNOSIS — L578 Other skin changes due to chronic exposure to nonionizing radiation: Secondary | ICD-10-CM | POA: Diagnosis not present

## 2022-07-05 DIAGNOSIS — D1801 Hemangioma of skin and subcutaneous tissue: Secondary | ICD-10-CM | POA: Diagnosis not present

## 2022-07-05 DIAGNOSIS — L821 Other seborrheic keratosis: Secondary | ICD-10-CM | POA: Diagnosis not present

## 2022-07-05 DIAGNOSIS — L814 Other melanin hyperpigmentation: Secondary | ICD-10-CM | POA: Diagnosis not present

## 2022-07-05 DIAGNOSIS — I781 Nevus, non-neoplastic: Secondary | ICD-10-CM | POA: Diagnosis not present

## 2022-07-05 DIAGNOSIS — D229 Melanocytic nevi, unspecified: Secondary | ICD-10-CM | POA: Diagnosis not present

## 2022-07-31 DIAGNOSIS — Z780 Asymptomatic menopausal state: Secondary | ICD-10-CM | POA: Diagnosis not present

## 2022-07-31 DIAGNOSIS — M85851 Other specified disorders of bone density and structure, right thigh: Secondary | ICD-10-CM | POA: Diagnosis not present

## 2022-08-07 ENCOUNTER — Ambulatory Visit: Payer: Medicare PPO | Admitting: Allergy and Immunology

## 2022-08-07 ENCOUNTER — Other Ambulatory Visit: Payer: Self-pay

## 2022-08-07 VITALS — BP 122/62 | HR 91 | Temp 97.7°F | Resp 16 | Ht 63.0 in | Wt 122.7 lb

## 2022-08-07 DIAGNOSIS — J4540 Moderate persistent asthma, uncomplicated: Secondary | ICD-10-CM | POA: Diagnosis not present

## 2022-08-07 DIAGNOSIS — K219 Gastro-esophageal reflux disease without esophagitis: Secondary | ICD-10-CM | POA: Diagnosis not present

## 2022-08-07 DIAGNOSIS — J310 Chronic rhinitis: Secondary | ICD-10-CM

## 2022-08-07 DIAGNOSIS — J3089 Other allergic rhinitis: Secondary | ICD-10-CM | POA: Diagnosis not present

## 2022-08-07 DIAGNOSIS — J301 Allergic rhinitis due to pollen: Secondary | ICD-10-CM

## 2022-08-07 MED ORDER — SPACER/AERO-HOLDING CHAMBERS DEVI: 1.0000 | Freq: Every day | 2 refills | Status: AC

## 2022-08-07 MED ORDER — ALBUTEROL SULFATE HFA 108 (90 BASE) MCG/ACT IN AERS: 2.0000 | INHALATION_SPRAY | RESPIRATORY_TRACT | 1 refills | Status: DC | PRN

## 2022-08-07 MED ORDER — IPRATROPIUM BROMIDE 0.06 % NA SOLN: 2.0000 | Freq: Four times a day (QID) | NASAL | 5 refills | Status: DC

## 2022-08-07 MED ORDER — BUDESONIDE-FORMOTEROL FUMARATE 160-4.5 MCG/ACT IN AERO: 2.0000 | INHALATION_SPRAY | Freq: Two times a day (BID) | RESPIRATORY_TRACT | 5 refills | Status: DC

## 2022-08-07 MED ORDER — LORATADINE 10 MG PO TABS: 10.0000 mg | ORAL_TABLET | Freq: Every day | ORAL | 5 refills | Status: DC | PRN

## 2022-08-07 MED ORDER — HYDROCOD POLI-CHLORPHE POLI ER 10-8 MG/5ML PO SUER: 5.0000 mL | Freq: Every evening | ORAL | 0 refills | Status: AC

## 2022-08-07 MED ORDER — OMEPRAZOLE 40 MG PO CPDR: 40.0000 mg | DELAYED_RELEASE_CAPSULE | Freq: Every day | ORAL | 5 refills | Status: DC

## 2022-08-07 NOTE — Progress Notes (Unsigned)
[place] - [place] - [place] - [place] - [place]   Follow-up Note  Referring Provider: Merri Brunette, MD Primary Provider: Merri Brunette, MD Date of Office Visit: 08/07/2022  Subjective:   Kathy Patel (DOB: 05-Aug-1950) is a 72 y.o. female who [facility] 08[facility]22 in re-evaluation of the following:  HPI: Kathy Patel returns to this clinic in evaluation of asthma, allergic rhinitis, gustatory rhinitis, LPR.  Her last visit to this clinic was 06 February 2022.  She was doing very well since her last visit without any significant respiratory tract symptoms and no issues with reflux and was not using any anti-inflammatory agents for her airway in a preventative mode and rarely if ever used the short acting bronchodilator.  She did not require systemic steroid or antibiotic for any type of airway issue.  Unfortunately, she was around sick young children between Christmas and New Year's and 4 days ago she developed acute onset of tickle in her throat and a cough and some nasal congestion and some head congestion and some ear pressure and then she developed complete laryngitis with loss of her voice.  She been coughing like crazy and her cough has been interfering with her sleep.  She did activate her "action plan" and she took Tussionex at nighttime at 5 mL at bedtime to help her cough each of the past 3 nights.  Today she has better and that she does not have laryngitis and she has no ear issues and she has no head issues but she still has a cough.  She has no chest pain or sputum production or shortness of breath or chest tightness and has not had any fever or chills or aches.  She did perform a home COVID swab test which was negative.  She has received this years flu vaccine and COVID-vaccine.  Allergies as of 08/07/2022   No Known Allergies      Medication List    albuterol 108 (90 Base) MCG/ACT inhaler Commonly known as: VENTOLIN HFA Inhale 2 puffs  into the lungs every 4 (four) hours as needed for wheezing or shortness of breath.   Lipitor 10 MG tablet Generic drug: atorvastatin Take 10 mg by mouth daily.   atorvastatin 10 MG tablet Commonly known as: LIPITOR Take 10 mg by mouth daily.   Biotin 10 MG Caps Take 1 capsule by mouth 2 (two) times daily.   budesonide-formoterol 160-4.5 MCG/ACT inhaler Commonly known as: Symbicort Inhale 2 puffs into the lungs 2 (two) times daily. With spacer   CALCIUM-VITAMIN D PO Take 1 capsule by mouth daily.   [employer] SUNSCREEN SPF 30 EX Apply 1 Application topically as needed.   melatonin 3 MG Tabs tablet 1 tablet at bedtime as needed   Myrbetriq 25 MG Tb24 tablet Generic drug: mirabegron ER Take 25 mg by mouth daily.   omeprazole 40 MG capsule Commonly known as: PRILOSEC Take 1 capsule (40 mg total) by mouth daily.   Restasis 0.05 % ophthalmic emulsion Generic drug: cycloSPORINE Place 1 drop into both eyes 2 (two) times daily.   Spacer/Aero-Holding Rudean Curt 1 each by Does not apply route daily.   tretinoin 0.025 % cream Commonly known as: RETIN-A Apply 1 drop topically at bedtime.   TURMERIC PO Take 1 tablet by mouth 2 (two) times daily.    Past Medical History:  Diagnosis Date   Hyperlipidemia    PAF (paroxysmal atrial fibrillation) (HCC)    s/p ablation 07/15/2015; anticoagulation w/ Eliquis  x 1 month    Past Surgical History:  Procedure Laterality Date   ATRIAL FIBRILLATION ABLATION  07/15/2015   ELECTROPHYSIOLOGIC STUDY  07/15/2015       ELECTROPHYSIOLOGIC STUDY N/A 07/15/2015   Procedure: Atrial Fibrillation Ablation;  Surgeon: Will Meredith Leeds, MD;  Location: [facility];  Service: Cardiovascular;  Laterality: N/A;   ELECTROPHYSIOLOGIC STUDY N/A 06/27/2016   Procedure: Atrial Fibrillation Ablation;  Surgeon: Will Meredith Leeds, MD;  Location: [facility];  Service: Cardiovascular;  Laterality: N/A;    Review of systems negative except  as noted in HPI / PMHx or noted below:  Review of Systems  Constitutional: Negative.   HENT: Negative.    Eyes: Negative.   Respiratory: Negative.    Cardiovascular: Negative.   Gastrointestinal: Negative.   Genitourinary: Negative.   Musculoskeletal: Negative.   Skin: Negative.   Neurological: Negative.   Endo/Heme/Allergies: Negative.   Psychiatric/Behavioral: Negative.       Objective:   Vitals:   08/07/22 1110  BP: 122/62  Pulse: 91  Resp: 16  Temp: 97.7 F (36.5 C)  SpO2: 100%   Height: 5\' 3"  (160 cm)  Weight: 122 lb 11.2 oz (55.7 kg)   Physical Exam Constitutional:      Appearance: She is not diaphoretic.  HENT:     Head: Normocephalic.     Right Ear: Tympanic membrane, ear canal and external ear normal.     Left Ear: Tympanic membrane, ear canal and external ear normal.     Nose: Nose normal. No mucosal edema or rhinorrhea.     Mouth/Throat:     Pharynx: Uvula midline. No oropharyngeal exudate.  Eyes:     Conjunctiva/sclera: Conjunctivae normal.  Neck:     Thyroid: No thyromegaly.     Trachea: Trachea normal. No tracheal tenderness or tracheal deviation.  Cardiovascular:     Rate and Rhythm: Normal rate and regular rhythm.     Heart sounds: Normal heart sounds, S1 normal and S2 normal. No murmur heard. Pulmonary:     Effort: No respiratory distress.     Breath sounds: Normal breath sounds. No stridor. No wheezing or rales.  Lymphadenopathy:     Head:     Right side of head: No tonsillar adenopathy.     Left side of head: No tonsillar adenopathy.     Cervical: No cervical adenopathy.  Skin:    Findings: No erythema or rash.     Nails: There is no clubbing.  Neurological:     Mental Status: She is alert.     Diagnostics:    Spirometry was performed and demonstrated an FEV1 of 1.36 at 63 % of predicted.  She had a difficult time performing this spirometric maneuver because of some cough.  Assessment and Plan:   1. Not well controlled  moderate persistent asthma   2. Perennial allergic rhinitis   3. Seasonal allergic rhinitis due to pollen   4. Gustatory rhinitis   5. LPRD (laryngopharyngeal reflux disease)    1.  Allergen avoidance measures - pollen  2. Treat and prevent inflammation:  A. Symbicort 160 - 2 inhalations 1-2 times per day w/spacer (empty lungs)  3. Treat and prevent reflux / LPR:  A. Decrease milligrams of caffeine per day  4. If needed:  A. Albuterol HFA - 2 inhalations every 4-6 hours B. Nasal ipratropium 0.06% - 2 sprays each nostril every 6 hours to dry nose  5. "Action Plan" for flare up:  A.  Increase Symbicort to 2 times per day B. Start omeprazole 40 mg - 2 times per day C. Start mucinex DM - 2 times per day D. Start loratadine 10 mg - 2 times per day  6. For this recent event:   A. Prednisone 10 mg - 1 tablet 1 time a day for 10 days only  B. Use "Action Plan"  C. Can use Tussionex -2.5-5.0 mL at nighttime for cough.  60 mL. NARCOTIC  7. Return to clinic in 6 months or earlier if problem   Joscelin has really done well until her most recent respiratory tract flareup which appears to be precipitated by a viral respiratory tract infection and she is actually little bit better today than she was yesterday and we will treat her with a very low-dose of systemic steroids for the next 5 to 10 days as well as continuing to have her use her "action plan".  I suspect she will do well within a week or so and she will contact me if that is not the case.  She does not really use any medications on a regular basis but certainly she has a selection of agents to be utilized as noted above to address respiratory tract issues that occur in the future.  If she does well I will see her back in this clinic in 6 months or earlier if there is a problem.  Laurette Schimke, MD Allergy / Immunology [facility]

## 2022-08-07 NOTE — Patient Instructions (Addendum)
  1.  Allergen avoidance measures - pollen  2. Treat and prevent inflammation:  A. Symbicort 160 - 2 inhalations 1-2 times per day w/spacer (empty lungs)  3. Treat and prevent reflux / LPR:  A. Decrease milligrams of caffeine per day  4. If needed:  A. Albuterol HFA - 2 inhalations every 4-6 hours B. Nasal ipratropium 0.06% - 2 sprays each nostril every 6 hours to dry nose  5. "Action Plan" for flare up:  A. Increase Symbicort to 2 times per day B. Start omeprazole 40 mg - 2 times per day C. Start mucinex DM - 2 times per day D. Start loratadine 10 mg - 2 times per day  6. For this recent event:   A. Prednisone 10 mg - 1 tablet 1 time a day for 10 days only  B. Use "Action Plan"  C. Can use Tussionex -2.5-5.0 mL at nighttime for cough.  60 mL. NARCOTIC  7. Return to clinic in 6 months or earlier if problem

## 2022-08-08 ENCOUNTER — Encounter: Payer: Self-pay | Admitting: Allergy and Immunology

## 2022-08-20 ENCOUNTER — Other Ambulatory Visit: Payer: Self-pay

## 2022-08-20 ENCOUNTER — Encounter: Payer: Self-pay | Admitting: Allergy and Immunology

## 2022-08-20 MED ORDER — SYMBICORT 160-4.5 MCG/ACT IN AERO: 2.0000 | INHALATION_SPRAY | Freq: Two times a day (BID) | RESPIRATORY_TRACT | 5 refills | Status: DC

## 2022-11-20 NOTE — Therapy (Unsigned)
OUTPATIENT PHYSICAL THERAPY FEMALE PELVIC EVALUATION   Patient Name: Kathy Patel MRN: [id number] DOB:July 23, 1951, 72 y.o., female Today's Date: 11/21/2022  END OF SESSION:  PT End of Session - 11/21/22 1059     Visit Number 1    Date for PT Re-Evaluation 02/13/23    Authorization Type [employer]    Progress Note Due on Visit 10    PT Start Time 0930    PT Stop Time 1010    PT Time Calculation (min) 40 min    Activity Tolerance Patient tolerated treatment well    Behavior During Therapy [facility] for tasks assessed/performed             Past Medical History:  Diagnosis Date   Hyperlipidemia    PAF (paroxysmal atrial fibrillation)    s/p ablation 07/15/2015; anticoagulation w/ Eliquis x 1 month   Past Surgical History:  Procedure Laterality Date   ATRIAL FIBRILLATION ABLATION  07/15/2015   ELECTROPHYSIOLOGIC STUDY  07/15/2015       ELECTROPHYSIOLOGIC STUDY N/A 07/15/2015   Procedure: Atrial Fibrillation Ablation;  Surgeon: Will Jorja Loa, MD;  [facility];  Service: Cardiovascular;  Laterality: N/A;   ELECTROPHYSIOLOGIC STUDY N/A 06/27/2016   Procedure: Atrial Fibrillation Ablation;  Surgeon: Will Jorja Loa, MD;  [facility]: [facility];  Service: Cardiovascular;  Laterality: N/A;   Patient Active Problem List   Diagnosis Date Noted   Allergic rhinitis due to pollen 02/05/2022   Bilateral tinnitus 02/05/2022   Insomnia 02/05/2022   Osteopenia 02/05/2022   Overactive bladder 02/05/2022   Prediabetes 02/05/2022   Pure hypercholesterolemia 02/05/2022   Reactive airway disease 02/05/2022   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/2022   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   AF (atrial fibrillation) 06/27/2016   AF (paroxysmal atrial fibrillation) 07/15/2015   Paroxysmal atrial fibrillation 05/17/2015   Right knee pain 09/17/2012   Right shoulder pain 09/17/2012    PCP:  Merri Brunette, MD   REFERRING PROVIDER: Merri Brunette, MD    REFERRING DIAG: M62.89 (ICD-10-CM) - Pelvic floor instability  THERAPY DIAG:  Muscle weakness (generalized)  Other lack of coordination  Rationale for Evaluation and Treatment: Rehabilitation  ONSET DATE: 2022  SUBJECTIVE:                                                                                                                                                                                           SUBJECTIVE STATEMENT: Started to have increased urgency with urination and felt like she had to get to the bathroom.   PAIN:  Are you having pain? No  PRECAUTIONS: None  WEIGHT BEARING RESTRICTIONS: No  FALLS:  Has patient fallen in last 6 months? No  LIVING ENVIRONMENT: Lives with: lives with their spouse  OCCUPATION: retired; yoga, play pickle ball and walks  PLOF: Independent  PATIENT GOALS: not have to take medication especially when she is to do anything.   PERTINENT HISTORY:  Atrial Fibrillation ablation; PAF   BOWEL MOVEMENT: Pain with bowel movement: No Type of bowel movement:Type (Bristol Stool Scale) Type 4 and Frequency 2 times per day Fully empty rectum: Yes:   Leakage: Yes: when she passes gas may have a little on her pantyliner  URINATION: Pain with urination: No Fully empty bladder: Yes:   Stream: Strong Urgency: Yes: has to know where the bathroom is due to this , stops often during hikes Frequency: urinate every 2-3 hours during the day; 1 time at night Leakage: Urge to void, Walking to the bathroom, Coughing, and Sneezing only when she has not taken her Mybetriq Pads: Yes: always uses a pantyliner  INTERCOURSE: not sexually active due to the pain in pelvic Pain with intercourse: Initial Penetration, During Penetration, and Pain Interrupts Intercourse Ability to have vaginal penetration:  No Climax: not vaginal penetration due to pain but able to orally Marinoff Scale: 3/3  PREGNANCY: Vaginal deliveries 3 Tearing Yes: long tear and  had to use forceps with first child, had placenta previa PROLAPSE: None   OBJECTIVE:   DIAGNOSTIC FINDINGS:  none  PATIENT SURVEYS:  [id number]  [id number]   COGNITION: Overall cognitive status: Within functional limits for tasks assessed     SENSATION: Light touch: Appears intact Proprioception: Appears intact     POSTURE: No Significant postural limitations  PELVIC ALIGNMENT:  LUMBARAROM/PROM: full lumbar ROM    LOWER EXTREMITY ROM:  Passive ROM Right eval Left eval  Hip external rotation 50 30     LOWER EXTREMITY MMT: Bilateral hip strength is 5/5   PALPATION:   General  cough will push her abdomen out, difficulty with expansion of the lower rib cage                External Perineal Exam tension in the urogenital diaphragm, vaginal dryness                             Internal Pelvic Floor tightness along the introitus, restrictions on the urethera  Patient confirms identification and approves PT to assess internal pelvic floor and treatment Yes  PELVIC MMT:   MMT eval  Vaginal 2/5 on the sides and 3/5 anterior and posterior  Diastasis Recti 1 finger width  (Blank rows = not tested)        TONE: average  PROLAPSE: none  TODAY'S TREATMENT:                                                                                                                              DATE: 11/21/22  EVAL See below   PATIENT EDUCATION:  Education details: educated patient on how to perform perineal massage and how to massage the estradiol into the vaginal canal tissue Person educated: Patient Education method: Explanation Education comprehension: verbalized understanding  HOME EXERCISE PROGRAM: See above  ASSESSMENT:  CLINICAL IMPRESSION: Patient is a 72 y.o. female who was seen today for physical therapy evaluation and treatment for pelvic instability. She has been having issues with urgency for 2 years. She will leak urine with urge to void, walking to the bathroom,  coughing, and sneezing only when she has not taking  her Mybetriq. Patient has to schedule where she goes so she has a bathroom available. She has to stop during her hikes to urinate and at times there is not a bathroom available. Vaginal strength is 2/5 on sides and 3/5 anterior and posterior with no hug of therapist finger. She has difficulty with expansion of the lower rib cage and will bulge her lower abdomen when coughing. She has restrictions around the bladder and tightness in the urogenital diaphragm and sides of the introitus. Patient will benefit from skilled therapy to reduce her urinary urgency and leakage to improve her quality of life.   OBJECTIVE IMPAIRMENTS: decreased coordination, decreased endurance, decreased strength, increased fascial restrictions, and increased muscle spasms.   ACTIVITY LIMITATIONS: continence, toileting, and locomotion level  PARTICIPATION LIMITATIONS: driving, shopping, and community activity  PERSONAL FACTORS: Age and 1-2 comorbidities: Atrial Fibrillation ablation; PAF  are also affecting patient's functional outcome.   REHAB POTENTIAL: Excellent  CLINICAL DECISION MAKING: Stable/uncomplicated  EVALUATION COMPLEXITY: Moderate   GOALS: Goals reviewed with patient? Yes  SHORT TERM GOALS: Target date: 12/18/22  Patient independent with initial HEP for pelvic floor and core strength Baseline: Goal status: INITIAL  2.  Patient was educated on the urge to void behavioral technique.  Baseline:  Goal status: INITIAL  3.  Patient pelvic floor strength is 2/5 with hug of therapist finger.  Baseline:  Goal status: INITIAL  4.  Patient has full mobility of lower rib cage to perform diaphragmatic breathing and relax the pelvic floor.  Baseline:  Goal status: INITIAL   LONG TERM GOALS: Target date: 02/13/23  Patient is independent with advanced HEP for core and pelvic floor strength.  Baseline:  Goal status: INITIAL  2.  Patient is able to wait  3 hours to urinate due to improve endurance of the pelvic floor muscles and reduction of urgency.  Baseline:  Goal status: INITIAL  3.  Patient is able to laugh, sneeze or cough without leaking urine due to pelvic floor strength >/= 3/5.  Baseline:  Goal status: INITIAL  4.  Patient has reduction of fascial restrictions around the urethra and urinary urgency has reduced >/= 75% Baseline:  Goal status: INITIAL  5.  Patient is able to go on a hike for 2-3 hours and not have to find a bathroom due to reduction of urgency.  Baseline:  Goal status: INITIAL  6.  Patient is able to go out in public and not have to know where the bathrooms are due to reduction of urinary urgency.  Baseline:  Goal status: INITIAL  PLAN:  PT FREQUENCY: 1x/week  PT DURATION: 12 weeks  PLANNED INTERVENTIONS: Therapeutic exercises, Therapeutic activity, Neuromuscular re-education, Patient/Family education, Dry Needling, Biofeedback, and Manual therapy  PLAN FOR NEXT SESSION: manual work to the vaginal canal, work on International aid/development worker, work on Database administrator breathing   Eulis Foster, PT 11/21/22 11:00 AM

## 2022-11-21 ENCOUNTER — Encounter: Payer: Self-pay | Admitting: Physical Therapy

## 2022-11-21 ENCOUNTER — Ambulatory Visit: Payer: Medicare PPO | Attending: Family Medicine | Admitting: Physical Therapy

## 2022-11-21 ENCOUNTER — Other Ambulatory Visit: Payer: Self-pay

## 2022-11-21 DIAGNOSIS — R278 Other lack of coordination: Secondary | ICD-10-CM | POA: Insufficient documentation

## 2022-11-21 DIAGNOSIS — H11153 Pinguecula, bilateral: Secondary | ICD-10-CM | POA: Diagnosis not present

## 2022-11-21 DIAGNOSIS — H11442 Conjunctival cysts, left eye: Secondary | ICD-10-CM | POA: Diagnosis not present

## 2022-11-21 DIAGNOSIS — M6281 Muscle weakness (generalized): Secondary | ICD-10-CM | POA: Diagnosis not present

## 2022-11-21 DIAGNOSIS — H10403 Unspecified chronic conjunctivitis, bilateral: Secondary | ICD-10-CM | POA: Diagnosis not present

## 2022-12-25 DIAGNOSIS — H10403 Unspecified chronic conjunctivitis, bilateral: Secondary | ICD-10-CM | POA: Diagnosis not present

## 2022-12-25 DIAGNOSIS — H04123 Dry eye syndrome of bilateral lacrimal glands: Secondary | ICD-10-CM | POA: Diagnosis not present

## 2022-12-26 ENCOUNTER — Encounter: Payer: Self-pay | Admitting: Physical Therapy

## 2022-12-26 ENCOUNTER — Ambulatory Visit: Payer: Medicare PPO | Attending: Family Medicine | Admitting: Physical Therapy

## 2022-12-26 DIAGNOSIS — R278 Other lack of coordination: Secondary | ICD-10-CM | POA: Insufficient documentation

## 2022-12-26 DIAGNOSIS — M6281 Muscle weakness (generalized): Secondary | ICD-10-CM | POA: Diagnosis not present

## 2022-12-26 NOTE — Therapy (Signed)
OUTPATIENT PHYSICAL THERAPY FEMALE PELVIC TREATMENT   Patient Name: Kathy Patel MRN: [id number] DOB:1950-12-14, 72 y.o., female Today's Date: 12/26/2022  END OF SESSION:  PT End of Session - 12/26/22 0844     Visit Number 2    Date for PT Re-Evaluation 02/13/23    Authorization Type [employer]    Authorization Time Period 11/21/2022-02/13/2023    Authorization - Visit Number 2    Authorization - Number of Visits 10    Progress Note Due on Visit 10    PT Start Time 0845    PT Stop Time 0925    PT Time Calculation (min) 40 min    Activity Tolerance Patient tolerated treatment well    Behavior During Therapy [facility] for tasks assessed/performed             Past Medical History:  Diagnosis Date   Hyperlipidemia    PAF (paroxysmal atrial fibrillation) (HCC)    s/p ablation 07/15/2015; anticoagulation w/ Eliquis x 1 month   Past Surgical History:  Procedure Laterality Date   ATRIAL FIBRILLATION ABLATION  07/15/2015   ELECTROPHYSIOLOGIC STUDY  07/15/2015       ELECTROPHYSIOLOGIC STUDY N/A 07/15/2015   Procedure: Atrial Fibrillation Ablation;  Surgeon: Will Jorja Loa, MD;  Location: [facility];  Service: Cardiovascular;  Laterality: N/A;   ELECTROPHYSIOLOGIC STUDY N/A 06/27/2016   Procedure: Atrial Fibrillation Ablation;  Surgeon: Will Jorja Loa, MD;  Location: [facility];  Service: Cardiovascular;  Laterality: N/A;   Patient Active Problem List   Diagnosis Date Noted   Allergic rhinitis due to pollen 02/05/2022   Bilateral tinnitus 02/05/2022   Insomnia 02/05/2022   Osteopenia 02/05/2022   Overactive bladder 02/05/2022   Prediabetes 02/05/2022   Pure hypercholesterolemia 02/05/2022   Reactive airway disease 02/05/2022   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/2022   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   AF (atrial fibrillation) (HCC) 06/27/2016   AF (paroxysmal atrial fibrillation) (HCC) 07/15/2015   Paroxysmal atrial fibrillation (HCC)  05/17/2015   Right knee pain 09/17/2012   Right shoulder pain 09/17/2012    PCP:  Merri Brunette, MD   REFERRING PROVIDER: Merri Brunette, MD   REFERRING DIAG: M62.89 (ICD-10-CM) - Pelvic floor instability  THERAPY DIAG:  Muscle weakness (generalized)  Other lack of coordination  Rationale for Evaluation and Treatment: Rehabilitation  ONSET DATE: 2022  SUBJECTIVE:                                                                                                                                                                                           SUBJECTIVE STATEMENT: I am not sure what physical  therapy does. Two times a month I will leak stool when I cough and sneeze.   PAIN:  Are you having pain? No  PRECAUTIONS: None  WEIGHT BEARING RESTRICTIONS: No  FALLS:  Has patient fallen in last 6 months? No  LIVING ENVIRONMENT: Lives with: lives with their spouse  OCCUPATION: retired; yoga, play pickle ball and walks  PLOF: Independent  PATIENT GOALS: not have to take medication especially when she is to do anything.   PERTINENT HISTORY:  Atrial Fibrillation ablation; PAF   BOWEL MOVEMENT: Pain with bowel movement: No Type of bowel movement:Type (Bristol Stool Scale) Type 4 and Frequency 2 times per day Fully empty rectum: Yes:   Leakage: Yes: when she passes gas may have a little on her pantyliner  URINATION: Pain with urination: No Fully empty bladder: Yes:   Stream: Strong Urgency: Yes: has to know where the bathroom is due to this , stops often during hikes Frequency: urinate every 2 hours during the day; 1 time at night Leakage: Urge to void, Walking to the bathroom, Coughing, and Sneezing only when she has not taken her Mybetriq Pads: Yes: always uses a pantyliner  INTERCOURSE: not sexually active due to the pain in pelvic Pain with intercourse: Initial Penetration, During Penetration, and Pain Interrupts Intercourse Ability to have vaginal penetration:   No Climax: not vaginal penetration due to pain but able to orally Marinoff Scale: 3/3  PREGNANCY: Vaginal deliveries 3 Tearing Yes: long tear and had to use forceps with first child, had placenta previa PROLAPSE: None   OBJECTIVE:   DIAGNOSTIC FINDINGS:  none  PATIENT SURVEYS:  UIQ-7  62   COGNITION: Overall cognitive status: Within functional limits for tasks assessed     SENSATION: Light touch: Appears intact Proprioception: Appears intact     POSTURE: No Significant postural limitations  PELVIC ALIGNMENT:  LUMBARAROM/PROM: full lumbar ROM    LOWER EXTREMITY ROM:  Passive ROM Right eval Left eval  Hip external rotation 50 30     LOWER EXTREMITY MMT: Bilateral hip strength is 5/5   PALPATION:   General  cough will push her abdomen out, difficulty with expansion of the lower rib cage                External Perineal Exam tension in the urogenital diaphragm, vaginal dryness                             Internal Pelvic Floor tightness along the introitus, restrictions on the urethera  Patient confirms identification and approves PT to assess internal pelvic floor and treatment Yes  PELVIC MMT:   MMT eval 12/26/22  Vaginal 2/5 on the sides and 3/5 anterior and posterior 3/5 and circular  Diastasis Recti 1 finger width   (Blank rows = not tested)        TONE: average  PROLAPSE: none  TODAY'S TREATMENT:    12/26/22 Manual: Soft tissue mobilization:  To bilateral diaphragm and abdominal muscles Myofascial release: Tissue rolling of the abdominals and lower rib cage Urogenital diaphragm release Internal pelvic floor techniques: No emotional/communication barriers or cognitive limitation. Patient is motivated to learn. Patient understands and agrees with treatment goals and plan. PT explains patient will be examined in standing, sitting, and lying down to see how their muscles and joints work. When they are ready, they will be asked to remove  their underwear so PT can examine their perineum. The patient is also given  the option of providing their own chaperone as one is not provided in our facility. The patient also has the right and is explained the right to defer or refuse any part of the evaluation or treatment including the internal exam. With the patient's consent, PT will use one gloved finger to gently assess the muscles of the pelvic floor, seeing how well it contracts and relaxes and if there is muscle symmetry. After, the patient will get dressed and PT and patient will discuss exam findings and plan of care. PT and patient discuss plan of care, schedule, attendance policy and HEP activities.  Going through the vaginal canal working on the introitus, levator ani, fascial release along the urethra and bladder Neuromuscular re-education: Pelvic floor contraction training: Pelvic floor contraction with therapist finger in the vaginal canal to assess contraction Down training: Diaphragmatic breathing in supine and sidely with therapist hands on the lower rib cage to expand the area Exercises: Stretches/mobility: Strengthening: Therapeutic activities: Functional strengthening activities:                                                                                                                            DATE: 11/21/22  EVAL See below  PATIENT EDUCATION: 12/26/22 Education details: Access Code: [email or web address] Person educated: Patient Education method: Programmer, multimedia, Facilities manager, Actor cues, Verbal cues, and Handouts Education comprehension: verbalized understanding, returned demonstration, verbal cues required, tactile cues required, and needs further education    HOME EXERCISE PROGRAM: 12/26/22 Access Code: [email or web address] URL: [email or web address] Date: 12/26/2022 Prepared by: Eulis Foster  Exercises - Sidelying Diaphragmatic Breathing  - 1 x daily - 7 x weekly - 1 sets - 10 reps  ASSESSMENT:  CLINICAL  IMPRESSION: Patient is a 72 y.o. female who was seen today for physical therapy  treatment for pelvic instability. Pelvic floor strength went to 3/5. She has some difficulty with pelvic floor drop. Patient has difficulty with relaxation of the abdominals.  Patient will benefit from skilled therapy to reduce her urinary urgency and leakage to improve her quality of life.   OBJECTIVE IMPAIRMENTS: decreased coordination, decreased endurance, decreased strength, increased fascial restrictions, and increased muscle spasms.   ACTIVITY LIMITATIONS: continence, toileting, and locomotion level  PARTICIPATION LIMITATIONS: driving, shopping, and community activity  PERSONAL FACTORS: Age and 1-2 comorbidities: Atrial Fibrillation ablation; PAF  are also affecting patient's functional outcome.   REHAB POTENTIAL: Excellent  CLINICAL DECISION MAKING: Stable/uncomplicated  EVALUATION COMPLEXITY: Moderate   GOALS: Goals reviewed with patient? Yes  SHORT TERM GOALS: Target date: 12/18/22  Patient independent with initial HEP for pelvic floor and core strength Baseline: Goal status: INITIAL  2.  Patient was educated on the urge to void behavioral technique.  Baseline:  Goal status: INITIAL  3.  Patient pelvic floor strength is 2/5 with hug of therapist finger.  Baseline:  Goal status: Met 12/26/22  4.  Patient has full mobility of lower rib cage to perform diaphragmatic breathing and relax the pelvic floor.  Baseline:  Goal status: INITIAL   LONG TERM GOALS: Target date: 02/13/23  Patient is independent with advanced HEP for core and pelvic floor strength.  Baseline:  Goal status: INITIAL  2.  Patient is able to wait 3 hours to urinate due to improve endurance of the pelvic floor muscles and reduction of urgency.  Baseline:  Goal status: INITIAL  3.  Patient is able to laugh, sneeze or cough without leaking urine due to pelvic floor strength >/= 3/5.  Baseline:  Goal status:  INITIAL  4.  Patient has reduction of fascial restrictions around the urethra and urinary urgency has reduced >/= 75% Baseline:  Goal status: INITIAL  5.  Patient is able to go on a hike for 2-3 hours and not have to find a bathroom due to reduction of urgency.  Baseline:  Goal status: INITIAL  6.  Patient is able to go out in public and not have to know where the bathrooms are due to reduction of urinary urgency.  Baseline:  Goal status: INITIAL  PLAN:  PT FREQUENCY: 1x/week  PT DURATION: 12 weeks  PLANNED INTERVENTIONS: Therapeutic exercises, Therapeutic activity, Neuromuscular re-education, Patient/Family education, Dry Needling, Biofeedback, and Manual therapy  PLAN FOR NEXT SESSION:  work on diaphragm and diaphragmatic breathing, lower abdominal contraction, pelvic floor contraction   Eulis Foster, PT 12/26/22 9:35 AM

## 2023-01-04 ENCOUNTER — Encounter: Payer: Self-pay | Admitting: Physical Therapy

## 2023-01-04 ENCOUNTER — Ambulatory Visit: Payer: Medicare PPO | Attending: Family Medicine | Admitting: Physical Therapy

## 2023-01-04 DIAGNOSIS — R278 Other lack of coordination: Secondary | ICD-10-CM | POA: Diagnosis not present

## 2023-01-04 DIAGNOSIS — M6281 Muscle weakness (generalized): Secondary | ICD-10-CM | POA: Diagnosis not present

## 2023-01-04 NOTE — Therapy (Addendum)
OUTPATIENT PHYSICAL THERAPY FEMALE PELVIC TREATMENT   Patient Name: Kathy Patel MRN: [id number] DOB:[birth year], 72 y.o., female Today's Date: 01/04/2023  END OF SESSION:  PT End of Session - 01/04/23 1022     Visit Number 3    Date for PT Re-Evaluation 02/13/23    Authorization Type [employer]    Authorization Time Period 11/21/2022-02/13/2023    Authorization - Visit Number 3    Authorization - Number of Visits 10    Progress Note Due on Visit 10    PT Start Time 1020    PT Stop Time 1100    PT Time Calculation (min) 40 min    Activity Tolerance Patient tolerated treatment well    Behavior During Therapy [facility] for tasks assessed/performed             Past Medical History:  Diagnosis Date   Hyperlipidemia    PAF (paroxysmal atrial fibrillation) (HCC)    s/p ablation 07/15/2015; anticoagulation w/ Eliquis x 1 month   Past Surgical History:  Procedure Laterality Date   ATRIAL FIBRILLATION ABLATION  07/15/2015   ELECTROPHYSIOLOGIC STUDY  07/15/2015       ELECTROPHYSIOLOGIC STUDY N/A 07/15/2015   Procedure: Atrial Fibrillation Ablation;  Surgeon: Will Jorja Loa, MD;  Location: [facility];  Service: Cardiovascular;  Laterality: N/A;   ELECTROPHYSIOLOGIC STUDY N/A 06/27/2016   Procedure: Atrial Fibrillation Ablation;  Surgeon: Will Jorja Loa, MD;  Location: [facility];  Service: Cardiovascular;  Laterality: N/A;   Patient Active Problem List   Diagnosis Date Noted   Allergic rhinitis due to pollen 02/05/2022   Bilateral tinnitus 02/05/2022   Insomnia 02/05/2022   Osteopenia 02/05/2022   Overactive bladder 02/05/2022   Prediabetes 02/05/2022   Pure hypercholesterolemia 02/05/2022   Reactive airway disease 02/05/2022   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/2022   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   AF (atrial fibrillation) (HCC) 06/27/2016   AF (paroxysmal atrial fibrillation) (HCC) 07/15/2015   Paroxysmal atrial fibrillation (HCC)  05/17/2015   Right knee pain 09/17/2012   Right shoulder pain 09/17/2012    PCP:  Merri Brunette, MD   REFERRING PROVIDER: Merri Brunette, MD   REFERRING DIAG: M62.89 (ICD-10-CM) - Pelvic floor instability  THERAPY DIAG:  Muscle weakness (generalized)  Other lack of coordination  Rationale for Evaluation and Treatment: Rehabilitation  ONSET DATE: 2022  SUBJECTIVE:                                                                                                                                                                                           SUBJECTIVE STATEMENT: I am working on waiting longer  to urinate.    PAIN:  Are you having pain? No  PRECAUTIONS: None  WEIGHT BEARING RESTRICTIONS: No  FALLS:  Has patient fallen in last 6 months? No  LIVING ENVIRONMENT: Lives with: lives with their spouse  OCCUPATION: retired; yoga, play pickle ball and walks  PLOF: Independent  PATIENT GOALS: not have to take medication especially when she is to do anything.   PERTINENT HISTORY:  Atrial Fibrillation ablation; PAF   BOWEL MOVEMENT: Pain with bowel movement: No Type of bowel movement:Type (Bristol Stool Scale) Type 4 and Frequency 2 times per day Fully empty rectum: Yes:   Leakage: Yes: when she passes gas may have a little on her pantyliner  URINATION: Pain with urination: No Fully empty bladder: Yes:   Stream: Strong Urgency: Yes: has to know where the bathroom is due to this , stops often during hikes Frequency: urinate every 2 hours during the day; 1 time at night Leakage: Urge to void, Walking to the bathroom, Coughing, and Sneezing only when she has not taken her Mybetriq Pads: Yes: always uses a pantyliner  INTERCOURSE: not sexually active due to the pain in pelvic Pain with intercourse: Initial Penetration, During Penetration, and Pain Interrupts Intercourse Ability to have vaginal penetration:  No Climax: not vaginal penetration due to pain but able  to orally Marinoff Scale: 3/3  PREGNANCY: Vaginal deliveries 3 Tearing Yes: long tear and had to use forceps with first child, had placenta previa PROLAPSE: None   OBJECTIVE:   DIAGNOSTIC FINDINGS:  none  PATIENT SURVEYS:  UIQ-7  68   COGNITION: Overall cognitive status: Within functional limits for tasks assessed     SENSATION: Light touch: Appears intact Proprioception: Appears intact     POSTURE: No Significant postural limitations  PELVIC ALIGNMENT:  LUMBARAROM/PROM: full lumbar ROM    LOWER EXTREMITY ROM:  Passive ROM Right eval Left eval  Hip external rotation 50 30     LOWER EXTREMITY MMT: Bilateral hip strength is 5/5   PALPATION:   General  cough will push her abdomen out, difficulty with expansion of the lower rib cage                External Perineal Exam tension in the urogenital diaphragm, vaginal dryness                             Internal Pelvic Floor tightness along the introitus, restrictions on the urethera  Patient confirms identification and approves PT to assess internal pelvic floor and treatment Yes  PELVIC MMT:   MMT eval 12/26/22  Vaginal 2/5 on the sides and 3/5 anterior and posterior 3/5 and circular  Diastasis Recti 1 finger width   (Blank rows = not tested)        TONE: average  PROLAPSE: none  TODAY'S TREATMENT:    01/04/23 Neuromuscular re-education: Down training: Diaphragmatic breathing in sitting and childs pose with tactile cues to open the back and abdomen to bring the air into her pelvic floor. Education on the urge to void to delay her urination to 3 hours Exercises: Stretches/mobility: Strengthening: Modified plank  with verbal cues to contract the abdominals and core but difficult to do Supine hip flexion isometric with one leg 10 x each leg Supine hip flexion with both holding 5 sec 10 x   12/26/22 Manual: Soft tissue mobilization:  To bilateral diaphragm and abdominal muscles Myofascial  release: Tissue rolling of the abdominals and lower  rib cage Urogenital diaphragm release Internal pelvic floor techniques: No emotional/communication barriers or cognitive limitation. Patient is motivated to learn. Patient understands and agrees with treatment goals and plan. PT explains patient will be examined in standing, sitting, and lying down to see how their muscles and joints work. When they are ready, they will be asked to remove their underwear so PT can examine their perineum. The patient is also given the option of providing their own chaperone as one is not provided in our facility. The patient also has the right and is explained the right to defer or refuse any part of the evaluation or treatment including the internal exam. With the patient's consent, PT will use one gloved finger to gently assess the muscles of the pelvic floor, seeing how well it contracts and relaxes and if there is muscle symmetry. After, the patient will get dressed and PT and patient will discuss exam findings and plan of care. PT and patient discuss plan of care, schedule, attendance policy and HEP activities.  Going through the vaginal canal working on the introitus, levator ani, fascial release along the urethra and bladder Neuromuscular re-education: Pelvic floor contraction training: Pelvic floor contraction with therapist finger in the vaginal canal to assess contraction Down training: Diaphragmatic breathing in supine and sidely with therapist hands on the lower rib cage to expand the area                                                         PATIENT EDUCATION: 01/04/23 Education details: Access Code: [email or web address], urge to void Person educated: Patient Education method: Explanation, Demonstration, Tactile cues, Verbal cues, and Handouts Education comprehension: verbalized understanding, returned demonstration, verbal cues required, tactile cues required, and needs further education    HOME EXERCISE  PROGRAM: 01/04/23 Access Code: [email or web address] URL: [email or web address] Date: 01/04/2023 Prepared by: Eulis Foster  Exercises - Sidelying Diaphragmatic Breathing  - 1 x daily - 7 x weekly - 1 sets - 10 reps - Supine Bilateral Isometric Hip Flexion  - 1 x daily - 3 x weekly - 1 sets - 10 reps - 5 sec hold  ASSESSMENT:  CLINICAL IMPRESSION: Patient is a 72 y.o. female who was seen today for physical therapy  treatment for pelvic instability. Patient is able to wait 2 hours to urinate.  She is able to contract her lower abdominals with hip flexion isometric. She is working on delaying the urge to void at home but not in public. Her life still revolves around the bathroom. Patient will benefit from skilled therapy to reduce her urinary urgency and leakage to improve her quality of life.   OBJECTIVE IMPAIRMENTS: decreased coordination, decreased endurance, decreased strength, increased fascial restrictions, and increased muscle spasms.   ACTIVITY LIMITATIONS: continence, toileting, and locomotion level  PARTICIPATION LIMITATIONS: driving, shopping, and community activity  PERSONAL FACTORS: Age and 1-2 comorbidities: Atrial Fibrillation ablation; PAF  are also affecting patient's functional outcome.   REHAB POTENTIAL: Excellent  CLINICAL DECISION MAKING: Stable/uncomplicated  EVALUATION COMPLEXITY: Moderate   GOALS: Goals reviewed with patient? Yes  SHORT TERM GOALS: Target date: 12/18/22  Patient independent with initial HEP for pelvic floor and core strength Baseline: Goal status: INITIAL  2.  Patient was educated on the urge to void behavioral technique.  Baseline:  Goal status: INITIAL  3.  Patient pelvic floor strength is 2/5 with hug of therapist finger.  Baseline:  Goal status: Met 12/26/22  4.  Patient has full mobility of lower rib cage to perform diaphragmatic breathing and relax the pelvic floor.  Baseline:  Goal status: INITIAL   LONG TERM GOALS: Target  date: 02/13/23  Patient is independent with advanced HEP for core and pelvic floor strength.  Baseline:  Goal status: INITIAL  2.  Patient is able to wait 3 hours to urinate due to improve endurance of the pelvic floor muscles and reduction of urgency.  Baseline:  Goal status: INITIAL  3.  Patient is able to laugh, sneeze or cough without leaking urine due to pelvic floor strength >/= 3/5.  Baseline:  Goal status: INITIAL  4.  Patient has reduction of fascial restrictions around the urethra and urinary urgency has reduced >/= 75% Baseline:  Goal status: INITIAL  5.  Patient is able to go on a hike for 2-3 hours and not have to find a bathroom due to reduction of urgency.  Baseline:  Goal status: INITIAL  6.  Patient is able to go out in public and not have to know where the bathrooms are due to reduction of urinary urgency.  Baseline:  Goal status: INITIAL  PLAN:  PT FREQUENCY: 1x/week  PT DURATION: 12 weeks  PLANNED INTERVENTIONS: Therapeutic exercises, Therapeutic activity, Neuromuscular re-education, Patient/Family education, Dry Needling, Biofeedback, and Manual therapy  PLAN FOR NEXT SESSION:  work on diaphragm and diaphragmatic breathing, lower abdominal contraction, pelvic floor contraction, manual work ot pelvic floor and urethra   Eulis Foster, PT 01/04/23 10:23 AM  PHYSICAL THERAPY DISCHARGE SUMMARY  Visits from Start of Care: 3  Current functional level related to goals / functional outcomes: See above.    Remaining deficits: See above. Patient did not return for formal assessment.    Education / Equipment: HEP   Patient agrees to discharge. Patient goals were not met. Patient is being discharged due to not returning since the last visit. Thank you for the referral.   Eulis Foster, PT 08/29/23 1:54 PM

## 2023-01-04 NOTE — Patient Instructions (Signed)
Urge Incontinence  Ideal urination frequency is every 2-4 wakeful hours, which equates to 5-8 times within a 24-hour period.   Urge incontinence is leakage that occurs when the bladder muscle contracts, creating a sudden need to go before getting to the bathroom.   Going too often when your bladder isn't actually full can disrupt the body's automatic signals to store and hold urine longer, which will increase urgency/frequency.  In this case, the bladder "is running the show" and strategies can be learned to retrain this pattern.   One should be able to control the first urge to urinate, at around .  The bladder can hold up to a "grande latte," or . To help you gain control, practice the Urge Drill below when urgency strikes.  This drill will help retrain your bladder signals and allow you to store and hold urine longer.  The overall goal is to stretch out your time between voids to reach a more manageable voiding schedule.    Practice your "quick flicks" often throughout the day (each waking hour) even when you don't need feel the urge to go.  This will help strengthen your pelvic floor muscles, making them more effective in controlling leakage.  Urge Drill  When you feel an urge to go, follow these steps to regain control: Stop what you are doing and be still Take one deep breath, directing your air into your abdomen Think an affirming thought, such as "I've got this." Do 5 quick flicks of your pelvic floor Heel raise 5 times with heel hitting the ground hard Walk with control to the bathroom to void, or delay voiding If urge happens again then repeat as above.   [facility] [address], [address] [place], [place] [address] Phone # [phone] Fax [phone]

## 2023-01-20 ENCOUNTER — Encounter: Payer: Self-pay | Admitting: Physical Therapy

## 2023-01-21 ENCOUNTER — Ambulatory Visit: Payer: Medicare PPO | Admitting: Physical Therapy

## 2023-02-11 ENCOUNTER — Encounter: Payer: Self-pay | Admitting: Cardiology

## 2023-02-11 ENCOUNTER — Ambulatory Visit: Payer: Medicare PPO | Attending: Cardiology | Admitting: Cardiology

## 2023-02-11 VITALS — BP 102/60 | HR 62 | Ht 63.0 in | Wt 122.0 lb

## 2023-02-11 DIAGNOSIS — I480 Paroxysmal atrial fibrillation: Secondary | ICD-10-CM | POA: Diagnosis not present

## 2023-02-11 NOTE — Progress Notes (Signed)
  Electrophysiology Office Note:   Date:  02/11/2023  ID:  Kathy Patel, DOB 09-18-1950, MRN [id number]  Primary Cardiologist: Caroll Cunnington Jorja Loa, MD Electrophysiologist: None      History of Present Illness:   Kathy Patel is a 72 y.o. female with h/o atrial fibrillation seen today for routine electrophysiology followup.  Since last being seen in our clinic the patient reports doing well.  She has no chest pain or shortness of breath.  She is able to do all of her daily activities.  She has noted no further episodes of atrial fibrillation since her last ablation..  she denies chest pain, palpitations, dyspnea, PND, orthopnea, nausea, vomiting, dizziness, syncope, edema, weight gain, or early satiety.   Review of systems complete and found to be negative unless listed in HPI.   EP Information / Studies Reviewed:    EKG is ordered today. Personal review as below.  EKG Interpretation Date/Time:  Monday February 11 2023 14:24:49 EDT Ventricular Rate:  62 PR Interval:  140 QRS Duration:  82 QT Interval:  438 QTC Calculation: 444 R Axis:   16  Text Interpretation: Sinus rhythm with marked sinus arrhythmia Low voltage QRS When compared with ECG of 27-Jul-2016 11:25, No significant change was found Confirmed by Aerial Dilley ([id number]) on 02/11/2023 2:32:57 PM    Risk Assessment/Calculations:    CHA2DS2-VASc Score = 2   This indicates a 2.2% annual risk of stroke. The patient's score is based upon: CHF History: 0 HTN History: 0 Diabetes History: 0 Stroke History: 0 Vascular Disease History: 0 Age Score: 1 Gender Score: 1              Physical Exam:   VS:  BP 102/60   Pulse 62   Ht 5\' 3"  (1.6 m)   Wt 122 lb (55.3 kg)   SpO2 96%   BMI 21.61 kg/m    Wt Readings from Last 3 Encounters:  02/11/23 122 lb (55.3 kg)  08/07/22 122 lb 11.2 oz (55.7 kg)  02/06/22 121 lb 12.8 oz (55.2 kg)     GEN: Well nourished, well developed in no acute distress NECK: No JVD; No carotid  bruits CARDIAC: Regular rate and rhythm, no murmurs, rubs, gallops RESPIRATORY:  Clear to auscultation without rales, wheezing or rhonchi  ABDOMEN: Soft, non-tender, non-distended EXTREMITIES:  No edema; No deformity   ASSESSMENT AND PLAN:    1.  Persistent atrial fibrillation: Status post ablation x 2, most recently November 2017.  She is fortunately remained in sinus rhythm without further episodes of atrial fibrillation.  Not currently anticoagulated.  Follow up with Dr. Elberta Fortis  as needed   Signed, Trynity Skousen Jorja Loa, MD

## 2023-02-12 ENCOUNTER — Ambulatory Visit: Payer: Medicare PPO | Admitting: Allergy and Immunology

## 2023-02-12 VITALS — BP 104/64 | HR 72 | Temp 100.3°F | Resp 16 | Ht 63.0 in | Wt 122.0 lb

## 2023-02-12 DIAGNOSIS — J301 Allergic rhinitis due to pollen: Secondary | ICD-10-CM

## 2023-02-12 DIAGNOSIS — J3089 Other allergic rhinitis: Secondary | ICD-10-CM | POA: Diagnosis not present

## 2023-02-12 DIAGNOSIS — J4540 Moderate persistent asthma, uncomplicated: Secondary | ICD-10-CM | POA: Diagnosis not present

## 2023-02-12 DIAGNOSIS — K219 Gastro-esophageal reflux disease without esophagitis: Secondary | ICD-10-CM | POA: Diagnosis not present

## 2023-02-12 MED ORDER — ALBUTEROL SULFATE HFA 108 (90 BASE) MCG/ACT IN AERS: 2.0000 | INHALATION_SPRAY | RESPIRATORY_TRACT | 1 refills | Status: AC | PRN

## 2023-02-12 MED ORDER — OMEPRAZOLE 40 MG PO CPDR: 40.0000 mg | DELAYED_RELEASE_CAPSULE | Freq: Two times a day (BID) | ORAL | 5 refills | Status: DC

## 2023-02-12 MED ORDER — CROMOLYN SODIUM 4 % OP SOLN: 1.0000 [drp] | Freq: Four times a day (QID) | OPHTHALMIC | 5 refills | Status: AC

## 2023-02-12 MED ORDER — SYMBICORT 160-4.5 MCG/ACT IN AERO: 2.0000 | INHALATION_SPRAY | Freq: Two times a day (BID) | RESPIRATORY_TRACT | 5 refills | Status: DC

## 2023-02-12 NOTE — Progress Notes (Unsigned)
[place] - [place] - [place] - [place]   Follow-up Note  Referring Provider: Merri Brunette, MD Primary Provider: Merri Brunette, MD Date of Office Visit: 02/12/2023  Subjective:   Kathy Patel (DOB: [birth year]) is a 72 y.o. female who [facility] in re-evaluation of the following:  HPI: Ryker returns to this clinic in evaluation of asthma, allergic rhinitis, gustatory rhinitis, LPR.  His last visit to this clinic was 07 August 2022.  She is really doing very well at this point in time while utilizing her medical therapy intermittently.  She does not use any preventative anti-inflammatory agents for either her upper or lower airway and she will rely on the use of Symbicort and omeprazole usually in combination when she develops a flareup of her coughing.  And she finds that this plan really works very well and she has had good control of her respiratory tract symptoms utilizing this plan.  Major triggers appear to be the development of a cold and sometimes exposure to pollen.  But she has been having problems with her eyes.  She does have dry eye and she is on Restasis and cromolyn and although this does help somewhat her eyes are always irritated and when she wakes up in the morning sometimes there is a crusty material in her eyes especially with the right eye.  Allergies as of 02/12/2023   No Known Allergies      Medication List    albuterol 108 (90 Base) MCG/ACT inhaler Commonly known as: VENTOLIN HFA Inhale 2 puffs into the lungs every 4 (four) hours as needed for wheezing or shortness of breath.   Biotin 10 MG Caps Take 1 capsule by mouth 2 (two) times daily.   CALCIUM-VITAMIN D PO Take 1 capsule by mouth daily.   chlorpheniramine-HYDROcodone 10-8 MG/5ML Commonly known as: TUSSIONEX Take 5 mLs by mouth at bedtime.   [employer] SUNSCREEN SPF 30 EX Apply 1 Application topically as needed.   estradiol 0.1 MG/GM  vaginal cream Commonly known as: ESTRACE Place 1 Applicatorful vaginally at bedtime.   Lipitor 10 MG tablet Generic drug: atorvastatin Take 10 mg by mouth daily.   melatonin 3 MG Tabs tablet 1 tablet at bedtime as needed   Myrbetriq 25 MG Tb24 tablet Generic drug: mirabegron ER Take 25 mg by mouth daily.   omeprazole 40 MG capsule Commonly known as: PRILOSEC Take 40 mg by mouth daily as needed (acid reflux).   Restasis 0.05 % ophthalmic emulsion Generic drug: cycloSPORINE Place 1 drop into both eyes 2 (two) times daily.   Spacer/Aero-Holding Rudean Curt 1 each by Does not apply route daily.   Symbicort 160-4.5 MCG/ACT inhaler Generic drug: budesonide-formoterol Inhale 2 puffs into the lungs 2 (two) times daily. With spacer   tretinoin 0.025 % cream Commonly known as: RETIN-A Apply 1 drop topically at bedtime.   TURMERIC PO Take 1 tablet by mouth 2 (two) times daily.    Past Medical History:  Diagnosis Date   Hyperlipidemia    PAF (paroxysmal atrial fibrillation) (HCC)    s/p ablation 07/15/2015; anticoagulation w/ Eliquis x 1 month    Past Surgical History:  Procedure Laterality Date   ATRIAL FIBRILLATION ABLATION  07/15/2015   ELECTROPHYSIOLOGIC STUDY  07/15/2015       ELECTROPHYSIOLOGIC STUDY N/A 07/15/2015   Procedure: Atrial Fibrillation Ablation;  Surgeon: Will Jorja Loa, MD;  Location: [facility];  Service: Cardiovascular;  Laterality: N/A;  ELECTROPHYSIOLOGIC STUDY N/A 06/27/2016   Procedure: Atrial Fibrillation Ablation;  Surgeon: Will Jorja Loa, MD;  Location: [facility];  Service: Cardiovascular;  Laterality: N/A;    Review of systems negative except as noted in HPI / PMHx or noted below:  Review of Systems  Constitutional: Negative.   HENT: Negative.    Eyes: Negative.   Respiratory: Negative.    Cardiovascular: Negative.   Gastrointestinal: Negative.   Genitourinary: Negative.   Musculoskeletal: Negative.    Skin: Negative.   Neurological: Negative.   Endo/Heme/Allergies: Negative.   Psychiatric/Behavioral: Negative.       Objective:   Vitals:   02/12/23 0915  BP: 104/64  Pulse: 72  Resp: 16  Temp: 100.3 F (37.9 C)  SpO2: 96%   Height: 5\' 3"  (160 cm)  Weight: 122 lb (55.3 kg)   Physical Exam Constitutional:      Appearance: She is not diaphoretic.  HENT:     Head: Normocephalic.     Right Ear: Tympanic membrane, ear canal and external ear normal.     Left Ear: Tympanic membrane, ear canal and external ear normal.     Nose: Nose normal. No mucosal edema or rhinorrhea.     Mouth/Throat:     Pharynx: Uvula midline. No oropharyngeal exudate.  Eyes:     Conjunctiva/sclera:     Right eye: Right conjunctiva is injected.     Left eye: Left conjunctiva is injected.  Neck:     Thyroid: No thyromegaly.     Trachea: Trachea normal. No tracheal tenderness or tracheal deviation.  Cardiovascular:     Rate and Rhythm: Normal rate and regular rhythm.     Heart sounds: Normal heart sounds, S1 normal and S2 normal. No murmur heard. Pulmonary:     Effort: No respiratory distress.     Breath sounds: Normal breath sounds. No stridor. No wheezing or rales.  Lymphadenopathy:     Head:     Right side of head: No tonsillar adenopathy.     Left side of head: No tonsillar adenopathy.     Cervical: No cervical adenopathy.  Skin:    Findings: No erythema or rash.     Nails: There is no clubbing.  Neurological:     Mental Status: She is alert.     Diagnostics: Spirometry was performed and demonstrated an FEV1 of 2.12 at 100 % of predicted.  Assessment and Plan:   1. Asthma, moderate persistent, well-controlled   2. Perennial allergic rhinitis   3. Seasonal allergic rhinitis due to pollen   4. LPRD (laryngopharyngeal reflux disease)    1.  Allergen avoidance measures - pollen.   2. Treat and prevent inflammation:  A. Symbicort 160 - 2 inhalations 1-2 times per day w/spacer (empty  lungs)  3. Treat and prevent reflux / LPR:  A. Decrease milligrams of caffeine per day  4. Treat and prevent irritated eyes (allergic + dry eye):  A. Continue Restasis B. Continue Cromolyn - INCREASE 4 times per day C. Systane gel drops at bedtime D. Systane drop multiple times per day (maybe with Cromolyn 4 x/day) E. Avoid oral antihistamines  5. If needed:  A. Symbicort 160 - 2 inhalations every 4-6 hours  6. "Action Plan" for flare up:  A. Start omeprazole 40 mg - 2 times per day B. Start mucinex DM - 2 times per day C. Start loratadine 10 mg - 2 times per day D. Start Symbicort - 2 inhalations 2 times per day  7. Plan for fall flu vaccine  8. Return to clinic in 6 months or earlier if problem   Griffin is doing very well with the intermittent use of anti-inflammatory agents for her airway and intermittent use of therapy directed against LPR and she will continue to use a combination of Symbicort and omeprazole whenever she develops respiratory tract symptoms.  We can have her use Symbicort as her rescue anti-inflammatory medicine at this point in time.  She is having a lot of problems with her allergic and dry eye situation and I made recommendations as noted above to address this issue.  This will certainly just be an issue of management and not cure.  Assuming she does well I will see her back in this clinic in 6 months or earlier if there is a problem.  Laurette Schimke, MD Allergy / Immunology  [facility]

## 2023-02-12 NOTE — Patient Instructions (Addendum)
  1.  Allergen avoidance measures - pollen.   2. Treat and prevent inflammation:  A. Symbicort 160 - 2 inhalations 1-2 times per day w/spacer (empty lungs)  3. Treat and prevent reflux / LPR:  A. Decrease milligrams of caffeine per day  4. Treat and prevent irritated eyes (allergic + dry eye):  A. Continue Restasis B. Continue Cromolyn - INCREASE 4 times per day C. Systane gel drops at bedtime D. Systane drop multiple times per day (maybe with Cromolyn 4 x/day) E. Avoid oral antihistamines  5. If needed:  A. Symbicort 160 - 2 inhalations every 4-6 hours  6. "Action Plan" for flare up:  A. Start omeprazole 40 mg - 2 times per day B. Start mucinex DM - 2 times per day C. Start loratadine 10 mg - 2 times per day D. Start Symbicort - 2 inhalations 2 times per day  7. Plan for fall flu vaccine  8. Return to clinic in 6 months or earlier if problem

## 2023-02-13 ENCOUNTER — Ambulatory Visit: Payer: Medicare PPO | Attending: Family Medicine | Admitting: Physical Therapy

## 2023-02-13 ENCOUNTER — Encounter: Payer: Self-pay | Admitting: Allergy and Immunology

## 2023-02-13 ENCOUNTER — Encounter: Payer: Self-pay | Admitting: Physical Therapy

## 2023-02-13 DIAGNOSIS — M6281 Muscle weakness (generalized): Secondary | ICD-10-CM | POA: Diagnosis not present

## 2023-02-13 DIAGNOSIS — R278 Other lack of coordination: Secondary | ICD-10-CM | POA: Insufficient documentation

## 2023-02-13 NOTE — Therapy (Signed)
OUTPATIENT PHYSICAL THERAPY FEMALE PELVIC TREATMENT   Patient Name: Kathy Patel MRN: [id number] DOB:11/02/1950, 72 y.o., female Today's Date: 02/13/2023  END OF SESSION:  PT End of Session - 02/13/23 1401     Visit Number 4    Date for PT Re-Evaluation 05/08/23    Authorization Type [employer]    Authorization Time Period 11/21/2022-02/13/2023    Authorization - Visit Number 4    Authorization - Number of Visits 10    PT Start Time 1400    PT Stop Time 1440    PT Time Calculation (min) 40 min    Activity Tolerance Patient tolerated treatment well    Behavior During Therapy [facility] for tasks assessed/performed             Past Medical History:  Diagnosis Date   Hyperlipidemia    PAF (paroxysmal atrial fibrillation) (HCC)    s/p ablation 07/15/2015; anticoagulation w/ Eliquis x 1 month   Past Surgical History:  Procedure Laterality Date   ATRIAL FIBRILLATION ABLATION  07/15/2015   ELECTROPHYSIOLOGIC STUDY  07/15/2015       ELECTROPHYSIOLOGIC STUDY N/A 07/15/2015   Procedure: Atrial Fibrillation Ablation;  Surgeon: Will Jorja Loa, MD;  Location: [facility];  Service: Cardiovascular;  Laterality: N/A;   ELECTROPHYSIOLOGIC STUDY N/A 06/27/2016   Procedure: Atrial Fibrillation Ablation;  Surgeon: Will Jorja Loa, MD;  Location: [facility];  Service: Cardiovascular;  Laterality: N/A;   Patient Active Problem List   Diagnosis Date Noted   Allergic rhinitis due to pollen 02/05/2022   Bilateral tinnitus 02/05/2022   Insomnia 02/05/2022   Osteopenia 02/05/2022   Overactive bladder 02/05/2022   Prediabetes 02/05/2022   Pure hypercholesterolemia 02/05/2022   Reactive airway disease 02/05/2022   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/2022   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   AF (atrial fibrillation) (HCC) 06/27/2016   AF (paroxysmal atrial fibrillation) (HCC) 07/15/2015   Paroxysmal atrial fibrillation (HCC) 05/17/2015   Right knee pain  09/17/2012   Right shoulder pain 09/17/2012    PCP:  Merri Brunette, MD   REFERRING PROVIDER: Merri Brunette, MD   REFERRING DIAG: M62.89 (ICD-10-CM) - Pelvic floor instability  THERAPY DIAG:  Muscle weakness (generalized)  Other lack of coordination  Rationale for Evaluation and Treatment: Rehabilitation  ONSET DATE: 2022  SUBJECTIVE:                                                                                                                                                                                           SUBJECTIVE STATEMENT: The leakage has improved and the breathing works the best for me. I could wait  15 minutes or more.  I do well with the medicine. I drink more than the average person.    PAIN:  Are you having pain? No  PRECAUTIONS: None  WEIGHT BEARING RESTRICTIONS: No  FALLS:  Has patient fallen in last 6 months? No  LIVING ENVIRONMENT: Lives with: lives with their spouse  OCCUPATION: retired; yoga, play pickle ball and walks  PLOF: Independent  PATIENT GOALS: not have to take medication especially when she is to do anything.   PERTINENT HISTORY:  Atrial Fibrillation ablation; PAF   BOWEL MOVEMENT: Pain with bowel movement: No Type of bowel movement:Type (Bristol Stool Scale) Type 4 and Frequency 2 times per day Fully empty rectum: Yes:   Leakage: Yes: when she passes gas may have a little on her pantyliner  URINATION: Pain with urination: No Fully empty bladder: Yes:   Stream: Strong Urgency: Yes: has to know where the bathroom is due to this , stops often during hikes Frequency: urinate every 2 hours during the day; 1 time at night Leakage: only when she is driving and not able to do her breathing exercise when she has the urge.  Pads: Yes: always uses a pantyliner  INTERCOURSE: not sexually active due to the pain in pelvic Pain with intercourse: Initial Penetration, During Penetration, and Pain Interrupts Intercourse Ability to  have vaginal penetration:  No Climax: not vaginal penetration due to pain but able to orally Marinoff Scale: 3/3  PREGNANCY: Vaginal deliveries 3 Tearing Yes: long tear and had to use forceps with first child, had placenta previa PROLAPSE: None   OBJECTIVE:   DIAGNOSTIC FINDINGS:  none  PATIENT SURVEYS:  UIQ-7  81 02/13/23 UIQ-7 33  COGNITION: Overall cognitive status: Within functional limits for tasks assessed     SENSATION: Light touch: Appears intact Proprioception: Appears intact     POSTURE: No Significant postural limitations  PELVIC ALIGNMENT:  LUMBARAROM/PROM: full lumbar ROM    LOWER EXTREMITY ROM:  Passive ROM Right eval Left eval  Hip external rotation 50 30     LOWER EXTREMITY MMT: Bilateral hip strength is 5/5   PALPATION:   General  cough will push her abdomen out, difficulty with expansion of the lower rib cage                External Perineal Exam tension in the urogenital diaphragm, vaginal dryness                             Internal Pelvic Floor tightness along the introitus, restrictions on the urethera  Patient confirms identification and approves PT to assess internal pelvic floor and treatment Yes  PELVIC MMT:   MMT eval 12/26/22 02/13/23  Vaginal 2/5 on the sides and 3/5 anterior and posterior 3/5 and circular 3/5 holding 3-5 seconds  Diastasis Recti 1 finger width    (Blank rows = not tested)        TONE: average  PROLAPSE: none  TODAY'S TREATMENT:    02/13/23 Manual: Internal pelvic floor techniques: No emotional/communication barriers or cognitive limitation. Patient is motivated to learn. Patient understands and agrees with treatment goals and plan. PT explains patient will be examined in standing, sitting, and lying down to see how their muscles and joints work. When they are ready, they will be asked to remove their underwear so PT can examine their perineum. The patient is also given the option of providing their  own chaperone as one is not  provided in our facility. The patient also has the right and is explained the right to defer or refuse any part of the evaluation or treatment including the internal exam. With the patient's consent, PT will use one gloved finger to gently assess the muscles of the pelvic floor, seeing how well it contracts and relaxes and if there is muscle symmetry. After, the patient will get dressed and PT and patient will discuss exam findings and plan of care. PT and patient discuss plan of care, schedule, attendance policy and HEP activities.  Going through the vaginal canal working manually on the levator ani, obturator internist, and sides of the introitus Neuromuscular re-education: Pelvic floor contraction training: Therapist finger in the vaginal canal working on coughing and pelvic floor contraction with tactile cues Therapist finger int he vaginal canal working on pelvic floor contraction holding for 5 seconds and engaging the lower abdominals and not over contracting the upper abdominals.      01/04/23 Neuromuscular re-education: Down training: Diaphragmatic breathing in sitting and childs pose with tactile cues to open the back and abdomen to bring the air into her pelvic floor. Education on the urge to void to delay her urination to 3 hours Exercises: Stretches/mobility: Strengthening: Modified plank  with verbal cues to contract the abdominals and core but difficult to do Supine hip flexion isometric with one leg 10 x each leg Supine hip flexion with both holding 5 sec 10 x   12/26/22 Manual: Soft tissue mobilization:  To bilateral diaphragm and abdominal muscles Myofascial release: Tissue rolling of the abdominals and lower rib cage Urogenital diaphragm release Internal pelvic floor techniques: No emotional/communication barriers or cognitive limitation. Patient is motivated to learn. Patient understands and agrees with treatment goals and plan. PT explains  patient will be examined in standing, sitting, and lying down to see how their muscles and joints work. When they are ready, they will be asked to remove their underwear so PT can examine their perineum. The patient is also given the option of providing their own chaperone as one is not provided in our facility. The patient also has the right and is explained the right to defer or refuse any part of the evaluation or treatment including the internal exam. With the patient's consent, PT will use one gloved finger to gently assess the muscles of the pelvic floor, seeing how well it contracts and relaxes and if there is muscle symmetry. After, the patient will get dressed and PT and patient will discuss exam findings and plan of care. PT and patient discuss plan of care, schedule, attendance policy and HEP activities.  Going through the vaginal canal working on the introitus, levator ani, fascial release along the urethra and bladder Neuromuscular re-education: Pelvic floor contraction training: Pelvic floor contraction with therapist finger in the vaginal canal to assess contraction Down training: Diaphragmatic breathing in supine and sidely with therapist hands on the lower rib cage to expand the area                                                         PATIENT EDUCATION: 02/13/23 Education details: Access Code: [email or web address], urge to void Person educated: Patient Education method: Explanation, Demonstration, Tactile cues, Verbal cues, and Handouts Education comprehension: verbalized understanding, returned demonstration, verbal cues required, tactile cues required, and needs  further education    HOME EXERCISE PROGRAM: 02/13/23 Access Code: [email or web address] URL: [email or web address] Date: 02/13/2023 Prepared by: Eulis Foster  Exercises - Supine Bilateral Isometric Hip Flexion  - 1 x daily - 3 x weekly - 1 sets - 10 reps - 5 sec hold - Supine Pelvic Floor Contraction  - 1 x daily - 7 x  weekly - 1 sets - 10 reps - 5 sec hold - Seated Pelvic Floor Contraction  - 1 x daily - 7 x weekly - 2 sets - 10 reps - 5 sec hold  ASSESSMENT:  CLINICAL IMPRESSION: Patient is a 72 y.o. female who was seen today for physical therapy  treatment for pelvic instability. Patient is able to wait 2 hours to urinate.  UIQ-7  score is 33 compared to 81. She is able to contract her lower abdominals with hip flexion isometric. She is working on delaying the urge to void at home but not in public using her diaphragmatic breathing. She has difficulty delaying the urge when driving due to not being able to focus on the breathing. Marland Kitchen Her life still revolves around the bathroom. Patient will bear down when coughing. Pelvic floor strength is 3/5 and holding for 3-5 seconds. She needs tactile cues to keep the lower abdominals engaged and not let the upper abdominals override the lower. Patient will benefit from skilled therapy to reduce her urinary urgency and leakage to improve her quality of life.   OBJECTIVE IMPAIRMENTS: decreased coordination, decreased endurance, decreased strength, increased fascial restrictions, and increased muscle spasms.   ACTIVITY LIMITATIONS: continence, toileting, and locomotion level  PARTICIPATION LIMITATIONS: driving, shopping, and community activity  PERSONAL FACTORS: Age and 1-2 comorbidities: Atrial Fibrillation ablation; PAF  are also affecting patient's functional outcome.   REHAB POTENTIAL: Excellent  CLINICAL DECISION MAKING: Stable/uncomplicated  EVALUATION COMPLEXITY: Moderate   GOALS: Goals reviewed with patient? Yes  SHORT TERM GOALS: Target date: 12/18/22  Patient independent with initial HEP for pelvic floor and core strength Baseline: Goal status: Met 02/13/23  2.  Patient was educated on the urge to void behavioral technique.  Baseline:  Goal status: 02/13/23  3.  Patient pelvic floor strength is 2/5 with hug of therapist finger.  Baseline:  Goal  status: Met 12/26/22  4.  Patient has full mobility of lower rib cage to perform diaphragmatic breathing and relax the pelvic floor.  Baseline:  Goal status: Met 02/13/23   LONG TERM GOALS: Target date:05/08/23 Patient is independent with advanced HEP for core and pelvic floor strength.  Baseline:  Goal status: ongoing 02/13/23  2.  Patient is able to wait 3 hours to urinate due to improve endurance of the pelvic floor muscles and reduction of urgency.  Baseline:  Goal status: ongoing 02/13/23  3.  Patient is able to laugh, sneeze or cough without leaking urine due to pelvic floor strength >/= 3/5.  Baseline:  Goal status: INITIAL  4.  Patient has reduction of fascial restrictions around the urethra and urinary urgency has reduced >/= 75% Baseline:  Goal status: 02/13/23  5.  Patient is able to go on a hike for 2-3 hours and not have to find a bathroom due to reduction of urgency.  Baseline:  Goal status: 02/13/23  6.  Patient is able to go out in public and not have to know where the bathrooms are due to reduction of urinary urgency.  Baseline:  Goal status: ongoing 02/13/23  PLAN:  PT FREQUENCY: 1x/week  PT DURATION: 12 weeks  PLANNED INTERVENTIONS: Therapeutic exercises, Therapeutic activity, Neuromuscular re-education, Patient/Family education, Dry Needling, Biofeedback, and Manual therapy  PLAN FOR NEXT SESSION:  work on diaphragm and diaphragmatic breathing, lower abdominal contraction, pelvic floor contraction, manual work ot pelvic floor and urethra; RUSI   Eulis Foster, PT 02/13/23 3:32 PM

## 2023-02-15 DIAGNOSIS — D485 Neoplasm of uncertain behavior of skin: Secondary | ICD-10-CM | POA: Diagnosis not present

## 2023-02-15 DIAGNOSIS — C434 Malignant melanoma of scalp and neck: Secondary | ICD-10-CM | POA: Diagnosis not present

## 2023-02-28 DIAGNOSIS — H43812 Vitreous degeneration, left eye: Secondary | ICD-10-CM | POA: Diagnosis not present

## 2023-02-28 DIAGNOSIS — H43392 Other vitreous opacities, left eye: Secondary | ICD-10-CM | POA: Diagnosis not present

## 2023-02-28 DIAGNOSIS — H26491 Other secondary cataract, right eye: Secondary | ICD-10-CM | POA: Diagnosis not present

## 2023-02-28 DIAGNOSIS — Z961 Presence of intraocular lens: Secondary | ICD-10-CM | POA: Diagnosis not present

## 2023-02-28 DIAGNOSIS — H11153 Pinguecula, bilateral: Secondary | ICD-10-CM | POA: Diagnosis not present

## 2023-02-28 DIAGNOSIS — H04123 Dry eye syndrome of bilateral lacrimal glands: Secondary | ICD-10-CM | POA: Diagnosis not present

## 2023-02-28 DIAGNOSIS — H10403 Unspecified chronic conjunctivitis, bilateral: Secondary | ICD-10-CM | POA: Diagnosis not present

## 2023-03-04 DIAGNOSIS — C434 Malignant melanoma of scalp and neck: Secondary | ICD-10-CM | POA: Diagnosis not present

## 2023-03-05 DIAGNOSIS — Z1231 Encounter for screening mammogram for malignant neoplasm of breast: Secondary | ICD-10-CM | POA: Diagnosis not present

## 2023-03-19 DIAGNOSIS — C434 Malignant melanoma of scalp and neck: Secondary | ICD-10-CM | POA: Diagnosis not present

## 2023-03-19 DIAGNOSIS — L821 Other seborrheic keratosis: Secondary | ICD-10-CM | POA: Diagnosis not present

## 2023-03-19 DIAGNOSIS — D1801 Hemangioma of skin and subcutaneous tissue: Secondary | ICD-10-CM | POA: Diagnosis not present

## 2023-03-19 DIAGNOSIS — D229 Melanocytic nevi, unspecified: Secondary | ICD-10-CM | POA: Diagnosis not present

## 2023-03-19 DIAGNOSIS — D485 Neoplasm of uncertain behavior of skin: Secondary | ICD-10-CM | POA: Diagnosis not present

## 2023-03-19 DIAGNOSIS — L719 Rosacea, unspecified: Secondary | ICD-10-CM | POA: Diagnosis not present

## 2023-03-19 DIAGNOSIS — L578 Other skin changes due to chronic exposure to nonionizing radiation: Secondary | ICD-10-CM | POA: Diagnosis not present

## 2023-03-19 DIAGNOSIS — L814 Other melanin hyperpigmentation: Secondary | ICD-10-CM | POA: Diagnosis not present

## 2023-03-20 DIAGNOSIS — C434 Malignant melanoma of scalp and neck: Secondary | ICD-10-CM | POA: Diagnosis not present

## 2023-03-21 DIAGNOSIS — J45909 Unspecified asthma, uncomplicated: Secondary | ICD-10-CM | POA: Diagnosis not present

## 2023-03-21 DIAGNOSIS — C434 Malignant melanoma of scalp and neck: Secondary | ICD-10-CM | POA: Diagnosis not present

## 2023-03-21 DIAGNOSIS — C439 Malignant melanoma of skin, unspecified: Secondary | ICD-10-CM | POA: Diagnosis not present

## 2023-03-21 DIAGNOSIS — I480 Paroxysmal atrial fibrillation: Secondary | ICD-10-CM | POA: Diagnosis not present

## 2023-03-21 DIAGNOSIS — Z91048 Other nonmedicinal substance allergy status: Secondary | ICD-10-CM | POA: Diagnosis not present

## 2023-03-21 DIAGNOSIS — Z7951 Long term (current) use of inhaled steroids: Secondary | ICD-10-CM | POA: Diagnosis not present

## 2023-03-25 DIAGNOSIS — C434 Malignant melanoma of scalp and neck: Secondary | ICD-10-CM | POA: Diagnosis not present

## 2023-04-08 DIAGNOSIS — C434 Malignant melanoma of scalp and neck: Secondary | ICD-10-CM | POA: Diagnosis not present

## 2023-05-06 DIAGNOSIS — C434 Malignant melanoma of scalp and neck: Secondary | ICD-10-CM | POA: Diagnosis not present

## 2023-05-28 DIAGNOSIS — C434 Malignant melanoma of scalp and neck: Secondary | ICD-10-CM | POA: Diagnosis not present

## 2023-06-20 DIAGNOSIS — L578 Other skin changes due to chronic exposure to nonionizing radiation: Secondary | ICD-10-CM | POA: Diagnosis not present

## 2023-06-20 DIAGNOSIS — L821 Other seborrheic keratosis: Secondary | ICD-10-CM | POA: Diagnosis not present

## 2023-06-20 DIAGNOSIS — Z85820 Personal history of malignant melanoma of skin: Secondary | ICD-10-CM | POA: Diagnosis not present

## 2023-06-20 DIAGNOSIS — D225 Melanocytic nevi of trunk: Secondary | ICD-10-CM | POA: Diagnosis not present

## 2023-06-25 DIAGNOSIS — Z8679 Personal history of other diseases of the circulatory system: Secondary | ICD-10-CM | POA: Diagnosis not present

## 2023-06-25 DIAGNOSIS — R7303 Prediabetes: Secondary | ICD-10-CM | POA: Diagnosis not present

## 2023-06-25 DIAGNOSIS — Z85820 Personal history of malignant melanoma of skin: Secondary | ICD-10-CM | POA: Diagnosis not present

## 2023-06-25 DIAGNOSIS — G4700 Insomnia, unspecified: Secondary | ICD-10-CM | POA: Diagnosis not present

## 2023-06-25 DIAGNOSIS — E7800 Pure hypercholesterolemia, unspecified: Secondary | ICD-10-CM | POA: Diagnosis not present

## 2023-06-25 DIAGNOSIS — Z1331 Encounter for screening for depression: Secondary | ICD-10-CM | POA: Diagnosis not present

## 2023-06-25 DIAGNOSIS — N3281 Overactive bladder: Secondary | ICD-10-CM | POA: Diagnosis not present

## 2023-06-25 DIAGNOSIS — Z0000 Encounter for general adult medical examination without abnormal findings: Secondary | ICD-10-CM | POA: Diagnosis not present

## 2023-06-25 DIAGNOSIS — N951 Menopausal and female climacteric states: Secondary | ICD-10-CM | POA: Diagnosis not present

## 2023-08-20 ENCOUNTER — Ambulatory Visit: Payer: Medicare PPO | Admitting: Allergy and Immunology

## 2023-09-09 DIAGNOSIS — M899 Disorder of bone, unspecified: Secondary | ICD-10-CM | POA: Diagnosis not present

## 2023-09-09 DIAGNOSIS — C434 Malignant melanoma of scalp and neck: Secondary | ICD-10-CM | POA: Diagnosis not present

## 2023-09-09 DIAGNOSIS — R918 Other nonspecific abnormal finding of lung field: Secondary | ICD-10-CM | POA: Diagnosis not present

## 2023-09-10 ENCOUNTER — Ambulatory Visit: Payer: Medicare PPO | Admitting: Allergy and Immunology

## 2023-09-10 ENCOUNTER — Other Ambulatory Visit: Payer: Self-pay

## 2023-09-10 ENCOUNTER — Encounter: Payer: Self-pay | Admitting: Allergy and Immunology

## 2023-09-10 VITALS — BP 102/70 | HR 69 | Temp 97.2°F | Resp 20 | Wt 125.8 lb

## 2023-09-10 DIAGNOSIS — J301 Allergic rhinitis due to pollen: Secondary | ICD-10-CM

## 2023-09-10 DIAGNOSIS — J4540 Moderate persistent asthma, uncomplicated: Secondary | ICD-10-CM

## 2023-09-10 DIAGNOSIS — D225 Melanocytic nevi of trunk: Secondary | ICD-10-CM | POA: Diagnosis not present

## 2023-09-10 DIAGNOSIS — J3089 Other allergic rhinitis: Secondary | ICD-10-CM | POA: Diagnosis not present

## 2023-09-10 DIAGNOSIS — L578 Other skin changes due to chronic exposure to nonionizing radiation: Secondary | ICD-10-CM | POA: Diagnosis not present

## 2023-09-10 DIAGNOSIS — Z85820 Personal history of malignant melanoma of skin: Secondary | ICD-10-CM | POA: Diagnosis not present

## 2023-09-10 DIAGNOSIS — K219 Gastro-esophageal reflux disease without esophagitis: Secondary | ICD-10-CM | POA: Diagnosis not present

## 2023-09-10 DIAGNOSIS — L570 Actinic keratosis: Secondary | ICD-10-CM | POA: Diagnosis not present

## 2023-09-10 DIAGNOSIS — L821 Other seborrheic keratosis: Secondary | ICD-10-CM | POA: Diagnosis not present

## 2023-09-10 MED ORDER — OMEPRAZOLE 40 MG PO CPDR: 40.0000 mg | DELAYED_RELEASE_CAPSULE | Freq: Every day | ORAL | 5 refills | Status: AC | PRN

## 2023-09-10 MED ORDER — LORATADINE 10 MG PO TABS: ORAL_TABLET | ORAL | 5 refills | Status: DC

## 2023-09-10 MED ORDER — SYMBICORT 160-4.5 MCG/ACT IN AERO: 2.0000 | INHALATION_SPRAY | Freq: Two times a day (BID) | RESPIRATORY_TRACT | 5 refills | Status: DC

## 2023-09-10 NOTE — Patient Instructions (Signed)
  1.  Allergen avoidance measures - pollen.   2. Treat and prevent inflammation:  A. Symbicort 160 - 2 inhalations 1-2 times per day w/spacer (empty lungs)  3. If needed:  A. Symbicort 160 - 2 inhalations every 4-6 hours  4. "Action Plan" for flare up:  A. Start omeprazole 40 mg - 2 times per day B. Start mucinex DM - 2 times per day C. Start loratadine 10 mg - 2 times per day D. Start Symbicort - 2 inhalations 2 times per day  5. Influenza = Tamiflu. Covid = Paxlovid  6. Return to clinic in 12 months or earlier if problem

## 2023-09-10 NOTE — Progress Notes (Unsigned)
[facility] [place] [facility] [place] [facility] [place] [facility] [place]   Follow-up Note  Referring Provider: Merri Brunette, MD Primary Provider: Merri Brunette, MD Date of Office Visit: 09/10/2023  Subjective:   Kathy Patel (DOB: [birth year]) is a 73 y.o. female who [facility] in re-evaluation of the following:  HPI: Kathy Patel returns to this clinic in evaluation of asthma, allergic rhinitis, gustatory rhinitis, LPR.  I last saw her in this clinic 12 February 2023.  She has really done well with her airway.  When she detects some lower airway symptoms she will immediately activate her "action plan" and within a few days everything resolves.  She has noticed that when playing pickle ball especially if it is cold or extremely hot she will get a little shortness of breath and she will use a short acting bronchodilator.  She has not required a systemic steroid or antibiotic for any type of airway issue.  Her reflux is doing quite well at this point in time without any consistent use of medical therapy.  When I last saw her in this clinic she was having some really irritated eyes and she has been under the care of a optometrist who is treating her with Restasis and cromolyn and lubricating eyedrops and her eyes are okay at this point in time.  She did obtain the COVID-vaccine but not the flu vaccine.  Allergies as of 09/10/2023   No Known Allergies      Medication List    albuterol 108 (90 Base) MCG/ACT inhaler Commonly known as: VENTOLIN HFA Inhale 2 puffs into the lungs every 4 (four) hours as needed for wheezing or shortness of breath.   Biotin 10 MG Caps Take 1 capsule by mouth 2 (two) times daily.   CALCIUM-VITAMIN D PO Take 1 capsule by mouth daily.   chlorpheniramine-HYDROcodone 10-8 MG/5ML Commonly known as: TUSSIONEX Take 5 mLs by mouth at bedtime.   cromolyn 4 % ophthalmic solution Commonly known as: OPTICROM Place 1 drop into both  eyes in the morning and at bedtime.   cromolyn 4 % ophthalmic solution Commonly known as: OPTICROM Place 1 drop into both eyes 4 (four) times daily.   [employer] SUNSCREEN SPF 30 EX Apply 1 Application topically as needed.   estradiol 0.1 MG/GM vaginal cream Commonly known as: ESTRACE Place 1 Applicatorful vaginally at bedtime.   Lipitor 10 MG tablet Generic drug: atorvastatin Take 10 mg by mouth daily.   melatonin 3 MG Tabs tablet 1 tablet at bedtime as needed   Myrbetriq 25 MG Tb24 tablet Generic drug: mirabegron ER Take 25 mg by mouth daily.   omeprazole 40 MG capsule Commonly known as: PRILOSEC Take 40 mg by mouth daily as needed (acid reflux).   omeprazole 40 MG capsule Commonly known as: PRILOSEC Take 1 capsule (40 mg total) by mouth in the morning and at bedtime.   Restasis 0.05 % ophthalmic emulsion Generic drug: cycloSPORINE Place 1 drop into both eyes 2 (two) times daily.   Spacer/Aero-Holding Rudean Curt 1 each by Does not apply route daily.   Symbicort 160-4.5 MCG/ACT inhaler Generic drug: budesonide-formoterol Inhale 2 puffs into the lungs 2 (two) times daily. With spacer   tretinoin 0.025 % cream Commonly known as: RETIN-A Apply 1 drop topically at bedtime.   TURMERIC PO Take 1 tablet by mouth 2 (two) times daily.    Past Medical History:  Diagnosis Date   Hyperlipidemia    PAF (paroxysmal atrial fibrillation) (  HCC)    s/p ablation 07/15/2015; anticoagulation w/ Eliquis x 1 month    Past Surgical History:  Procedure Laterality Date   ATRIAL FIBRILLATION ABLATION  07/15/2015   ELECTROPHYSIOLOGIC STUDY  07/15/2015       ELECTROPHYSIOLOGIC STUDY N/A 07/15/2015   Procedure: Atrial Fibrillation Ablation;  Surgeon: Will Jorja Loa, MD;  Location: [facility];  Service: Cardiovascular;  Laterality: N/A;   ELECTROPHYSIOLOGIC STUDY N/A 06/27/2016   Procedure: Atrial Fibrillation Ablation;  Surgeon: Will Jorja Loa, MD;  Location: [facility]  INVASIVE CV [facility];  Service: Cardiovascular;  Laterality: N/A;    Review of systems negative except as noted in HPI / PMHx or noted below:  Review of Systems  Constitutional: Negative.   HENT: Negative.    Eyes: Negative.   Respiratory: Negative.    Cardiovascular: Negative.   Gastrointestinal: Negative.   Genitourinary: Negative.   Musculoskeletal: Negative.   Skin: Negative.   Neurological: Negative.   Endo/Heme/Allergies: Negative.   Psychiatric/Behavioral: Negative.       Objective:   Vitals:   09/10/23 1352  BP: 102/70  Pulse: 69  Resp: 20  Temp: (!) 97.2 F (36.2 C)  SpO2: 98%      Weight: 125 lb 12.8 oz (57.1 kg)   Physical Exam Constitutional:      Appearance: She is not diaphoretic.  HENT:     Head: Normocephalic.     Right Ear: Tympanic membrane, ear canal and external ear normal.     Left Ear: Tympanic membrane, ear canal and external ear normal.     Nose: Nose normal. No mucosal edema or rhinorrhea.     Mouth/Throat:     Pharynx: Uvula midline. No oropharyngeal exudate.  Eyes:     Conjunctiva/sclera: Conjunctivae normal.  Neck:     Thyroid: No thyromegaly.     Trachea: Trachea normal. No tracheal tenderness or tracheal deviation.  Cardiovascular:     Rate and Rhythm: Normal rate and regular rhythm.     Heart sounds: Normal heart sounds, S1 normal and S2 normal. No murmur heard. Pulmonary:     Effort: No respiratory distress.     Breath sounds: Normal breath sounds. No stridor. No wheezing or rales.  Lymphadenopathy:     Head:     Right side of head: No tonsillar adenopathy.     Left side of head: No tonsillar adenopathy.     Cervical: No cervical adenopathy.  Skin:    Findings: No erythema or rash.     Nails: There is no clubbing.  Neurological:     Mental Status: She is alert.     Diagnostics: Spirometry was performed and demonstrated an FEV1 of 1.99 at 95 % of predicted.   Assessment and Plan:   1. Asthma, moderate persistent,  well-controlled   2. Perennial allergic rhinitis   3. Seasonal allergic rhinitis due to pollen   4. LPRD (laryngopharyngeal reflux disease)    1.  Allergen avoidance measures - pollen.   2. Treat and prevent inflammation:  A. Symbicort 160 - 2 inhalations 1-2 times per day w/spacer (empty lungs)  3. If needed:  A. Symbicort 160 - 2 inhalations every 4-6 hours  4. "Action Plan" for flare up:  A. Start omeprazole 40 mg - 2 times per day B. Start mucinex DM - 2 times per day C. Start loratadine 10 mg - 2 times per day D. Start Symbicort - 2 inhalations 2 times per day  5. Influenza = Tamiflu. Covid =  Paxlovid  6. Return to clinic in 12 months or earlier if problem   Tyashia is doing very well regarding her airway.  She has latched onto use a "action plan" for whenever she develops respiratory tract symptoms and this appears to be working quite well.  I did inform her that she can use Symbicort in a preventative manner but this is not really her interest at this point.  She will be going to [place] in 3 weeks I did mention that she may want to consider using Symbicort at least once a day for 2 weeks prior to that visit and during that visit as a preventative so she does not get into trouble when she is in a foreign country.  It does not appear as though her reflux is causing her much problem and does not require any treatment at this point.  Her eye issues being handled by her ophthalmologist.  I will see her back in this clinic in 1 year or earlier if there is a problem.  Laurette Schimke, MD Allergy / Immunology [facility]

## 2023-09-11 ENCOUNTER — Encounter: Payer: Self-pay | Admitting: Allergy and Immunology

## 2023-09-16 DIAGNOSIS — M899 Disorder of bone, unspecified: Secondary | ICD-10-CM | POA: Diagnosis not present

## 2023-09-23 DIAGNOSIS — Z961 Presence of intraocular lens: Secondary | ICD-10-CM | POA: Diagnosis not present

## 2023-09-23 DIAGNOSIS — H26492 Other secondary cataract, left eye: Secondary | ICD-10-CM | POA: Diagnosis not present

## 2023-09-23 DIAGNOSIS — H04123 Dry eye syndrome of bilateral lacrimal glands: Secondary | ICD-10-CM | POA: Diagnosis not present

## 2023-10-16 ENCOUNTER — Other Ambulatory Visit: Payer: Self-pay

## 2023-10-16 ENCOUNTER — Encounter: Payer: Self-pay | Admitting: Allergy and Immunology

## 2023-10-16 MED ORDER — ALBUTEROL SULFATE HFA 108 (90 BASE) MCG/ACT IN AERS: 2.0000 | INHALATION_SPRAY | RESPIRATORY_TRACT | 5 refills | Status: DC | PRN

## 2023-11-18 DIAGNOSIS — H04123 Dry eye syndrome of bilateral lacrimal glands: Secondary | ICD-10-CM | POA: Diagnosis not present

## 2023-12-06 DIAGNOSIS — Z411 Encounter for cosmetic surgery: Secondary | ICD-10-CM | POA: Diagnosis not present

## 2023-12-06 DIAGNOSIS — Z85820 Personal history of malignant melanoma of skin: Secondary | ICD-10-CM | POA: Diagnosis not present

## 2023-12-06 DIAGNOSIS — L821 Other seborrheic keratosis: Secondary | ICD-10-CM | POA: Diagnosis not present

## 2023-12-06 DIAGNOSIS — L578 Other skin changes due to chronic exposure to nonionizing radiation: Secondary | ICD-10-CM | POA: Diagnosis not present

## 2023-12-06 DIAGNOSIS — D225 Melanocytic nevi of trunk: Secondary | ICD-10-CM | POA: Diagnosis not present

## 2024-03-02 DIAGNOSIS — R918 Other nonspecific abnormal finding of lung field: Secondary | ICD-10-CM | POA: Diagnosis not present

## 2024-03-02 DIAGNOSIS — C434 Malignant melanoma of scalp and neck: Secondary | ICD-10-CM | POA: Diagnosis not present

## 2024-03-03 DIAGNOSIS — Z411 Encounter for cosmetic surgery: Secondary | ICD-10-CM | POA: Diagnosis not present

## 2024-03-03 DIAGNOSIS — Z85820 Personal history of malignant melanoma of skin: Secondary | ICD-10-CM | POA: Diagnosis not present

## 2024-03-03 DIAGNOSIS — L821 Other seborrheic keratosis: Secondary | ICD-10-CM | POA: Diagnosis not present

## 2024-03-03 DIAGNOSIS — L578 Other skin changes due to chronic exposure to nonionizing radiation: Secondary | ICD-10-CM | POA: Diagnosis not present

## 2024-03-03 DIAGNOSIS — D225 Melanocytic nevi of trunk: Secondary | ICD-10-CM | POA: Diagnosis not present

## 2024-03-12 ENCOUNTER — Other Ambulatory Visit: Payer: Self-pay | Admitting: Allergy and Immunology

## 2024-03-25 DIAGNOSIS — H04123 Dry eye syndrome of bilateral lacrimal glands: Secondary | ICD-10-CM | POA: Diagnosis not present

## 2024-05-20 DIAGNOSIS — R195 Other fecal abnormalities: Secondary | ICD-10-CM | POA: Diagnosis not present

## 2024-05-22 DIAGNOSIS — Z1231 Encounter for screening mammogram for malignant neoplasm of breast: Secondary | ICD-10-CM | POA: Diagnosis not present

## 2024-06-04 DIAGNOSIS — Z85820 Personal history of malignant melanoma of skin: Secondary | ICD-10-CM | POA: Diagnosis not present

## 2024-06-04 DIAGNOSIS — L821 Other seborrheic keratosis: Secondary | ICD-10-CM | POA: Diagnosis not present

## 2024-06-04 DIAGNOSIS — D225 Melanocytic nevi of trunk: Secondary | ICD-10-CM | POA: Diagnosis not present

## 2024-06-04 DIAGNOSIS — L578 Other skin changes due to chronic exposure to nonionizing radiation: Secondary | ICD-10-CM | POA: Diagnosis not present

## 2024-06-04 DIAGNOSIS — L570 Actinic keratosis: Secondary | ICD-10-CM | POA: Diagnosis not present

## 2024-06-09 DIAGNOSIS — R911 Solitary pulmonary nodule: Secondary | ICD-10-CM | POA: Diagnosis not present

## 2024-06-09 DIAGNOSIS — C434 Malignant melanoma of scalp and neck: Secondary | ICD-10-CM | POA: Diagnosis not present

## 2024-06-16 DIAGNOSIS — R195 Other fecal abnormalities: Secondary | ICD-10-CM | POA: Diagnosis not present

## 2024-06-16 DIAGNOSIS — K648 Other hemorrhoids: Secondary | ICD-10-CM | POA: Diagnosis not present

## 2024-06-18 DIAGNOSIS — R195 Other fecal abnormalities: Secondary | ICD-10-CM | POA: Diagnosis not present

## 2024-07-28 ENCOUNTER — Ambulatory Visit: Payer: Medicare PPO | Admitting: Allergy and Immunology

## 2024-09-01 ENCOUNTER — Encounter: Payer: Self-pay | Admitting: Allergy

## 2024-09-01 ENCOUNTER — Other Ambulatory Visit: Payer: Self-pay

## 2024-09-01 ENCOUNTER — Ambulatory Visit: Admitting: Allergy

## 2024-09-01 VITALS — BP 92/60 | HR 70 | Temp 98.8°F | Resp 18 | Ht 63.0 in | Wt 130.2 lb

## 2024-09-01 DIAGNOSIS — J4540 Moderate persistent asthma, uncomplicated: Secondary | ICD-10-CM

## 2024-09-01 DIAGNOSIS — J301 Allergic rhinitis due to pollen: Secondary | ICD-10-CM

## 2024-09-01 DIAGNOSIS — K219 Gastro-esophageal reflux disease without esophagitis: Secondary | ICD-10-CM | POA: Diagnosis not present

## 2024-09-01 MED ORDER — OMEPRAZOLE 40 MG PO CPDR: 40.0000 mg | DELAYED_RELEASE_CAPSULE | Freq: Two times a day (BID) | ORAL | 11 refills | Status: AC

## 2024-09-01 MED ORDER — LORATADINE 10 MG PO TABS: ORAL_TABLET | ORAL | 5 refills | Status: AC

## 2024-09-01 MED ORDER — SYMBICORT 160-4.5 MCG/ACT IN AERO: 2.0000 | INHALATION_SPRAY | Freq: Two times a day (BID) | RESPIRATORY_TRACT | 5 refills | Status: AC

## 2024-09-01 NOTE — Patient Instructions (Signed)
  1.  Allergen avoidance measures - pollen.   2. Treat and prevent inflammation:  A. Symbicort 160 - 2 inhalations 1-2 times per day w/spacer (empty lungs)  3. If needed:  A. Symbicort 160 - 2 inhalations every 4-6 hours  4. "Action Plan" for flare up:  A. Start omeprazole 40 mg - 2 times per day B. Start mucinex DM - 2 times per day C. Start loratadine 10 mg - 2 times per day D. Start Symbicort - 2 inhalations 2 times per day  5. Influenza = Tamiflu. Covid = Paxlovid  6. Return to clinic in 12 months or earlier if problem

## 2025-08-31 ENCOUNTER — Ambulatory Visit: Admitting: Allergy and Immunology
